# Patient Record
Sex: Female | Born: 1960 | Hispanic: No | Smoking: Former smoker
Health system: Southern US, Community
[De-identification: ages and names within clinical notes are randomized; demographics above are authoritative.]

## PROBLEM LIST (undated history)

## (undated) DIAGNOSIS — E119 Type 2 diabetes mellitus without complications: Secondary | ICD-10-CM

## (undated) DIAGNOSIS — M1990 Unspecified osteoarthritis, unspecified site: Secondary | ICD-10-CM

## (undated) DIAGNOSIS — I10 Essential (primary) hypertension: Secondary | ICD-10-CM

## (undated) DIAGNOSIS — J189 Pneumonia, unspecified organism: Secondary | ICD-10-CM

## (undated) HISTORY — DX: Type 2 diabetes mellitus without complications: E11.9

## (undated) HISTORY — PX: ABDOMINAL HYSTERECTOMY: SHX81

## (undated) HISTORY — PX: VEIN SURGERY: SHX48

## (undated) HISTORY — PX: FOOT SURGERY: SHX648

---

## 2006-11-17 ENCOUNTER — Ambulatory Visit (HOSPITAL_COMMUNITY): Admission: RE | Admit: 2006-11-17 | Discharge: 2006-11-17 | Payer: Self-pay | Admitting: Obstetrics and Gynecology

## 2006-11-17 ENCOUNTER — Encounter (INDEPENDENT_AMBULATORY_CARE_PROVIDER_SITE_OTHER): Payer: Self-pay | Admitting: Obstetrics and Gynecology

## 2006-12-09 ENCOUNTER — Encounter: Admission: RE | Admit: 2006-12-09 | Discharge: 2006-12-09 | Payer: Self-pay | Admitting: Gastroenterology

## 2007-05-28 ENCOUNTER — Ambulatory Visit (HOSPITAL_COMMUNITY): Admission: RE | Admit: 2007-05-28 | Discharge: 2007-05-28 | Payer: Self-pay | Admitting: Obstetrics and Gynecology

## 2007-10-06 ENCOUNTER — Ambulatory Visit (HOSPITAL_COMMUNITY): Admission: RE | Admit: 2007-10-06 | Discharge: 2007-10-07 | Payer: Self-pay | Admitting: Internal Medicine

## 2007-10-06 ENCOUNTER — Encounter (INDEPENDENT_AMBULATORY_CARE_PROVIDER_SITE_OTHER): Payer: Self-pay | Admitting: Obstetrics and Gynecology

## 2010-09-06 ENCOUNTER — Other Ambulatory Visit: Payer: Self-pay | Admitting: Specialist

## 2010-09-06 ENCOUNTER — Ambulatory Visit
Admission: RE | Admit: 2010-09-06 | Discharge: 2010-09-06 | Disposition: A | Discharge status: Home / Self Care | Payer: BC Managed Care – PPO | Source: Ambulatory Visit | Attending: Specialist | Admitting: Specialist

## 2010-09-06 DIAGNOSIS — T1490XA Injury, unspecified, initial encounter: Secondary | ICD-10-CM

## 2010-10-22 NOTE — Discharge Summary (Signed)
NAMEDARRIN, APODACA            ACCOUNT NO.:  [id number]   MEDICAL RECORD NO.:  [id number]          PATIENT TYPE:  OIB   LOCATION:  9305                          FACILITY:  [facility]   PHYSICIAN:  Duke Salvia. Marcelle Overlie, M.D.DATE OF BIRTH:  [birth year]   DATE OF ADMISSION:  10/06/2007  DATE OF DISCHARGE:  10/07/2007                               DISCHARGE SUMMARY   DISCHARGE DIAGNOSES:  1. Chronic pelvic pain, dysmenorrhea.  2. Laparoscopic-assisted vaginal hysterectomy, bilateral salpingo-      oophorectomy this admission.   Summary of the history and physical exam, please see admission H and P  for details.  Briefly, a 50 year old perimenopausal patient who has had  worsening pelvic pain and dysmenorrhea after a negative evaluation  including CT scan and diagnostic laparoscopy, presents for definitive  surgery.   HOSPITAL COURSE:  On October 06, 2007, under general anesthesia, the  patient underwent LAVH BSO with a 100 mL blood loss.  The following  a.m., her catheter was removed.  She was voiding without difficulty,  tolerating a regular diet, her abdominal exam was unremarkable and she  was afebrile.   LAB DATA:  Blood type is B negative, antibody screen negative.  CMET on  admission normal except for glucose 118.  WBC 7.0, hemoglobin 13.5,  magnesium of 38.3, platelets 239,000.  UPT negative.  Postop CBC on  October 07, 2007, WBC 10.0, hemoglobin 11.3, hematocrit 31.8.   DISPOSITION:  The patient discharged on Tylox p.r.n. pain.  Return to  the office in 1 week.  Advised to report for any incisional redness or  drainage, increased pain or bleeding or fever over 101.  She was given  specific instructions regarding diet, sex, exercise.   CONDITION:  Good.   ACTIVITY:  Graded increase.      Richard M. Marcelle Overlie, M.D.  Electronically Signed     RMH/MEDQ  D:  10/07/2007  T:  10/07/2007  Job:  [id number]

## 2010-10-22 NOTE — Op Note (Signed)
Tonya Farrell, Tonya Farrell            ACCOUNT NO.:  [id number]   MEDICAL RECORD NO.:  [id number]          PATIENT TYPE:  AMB   LOCATION:  SDC                           [facility]ILITY:  [facility]   PHYSICIAN:  Duke Salvia. Marcelle Overlie, M.D.DATE OF BIRTH:  1960/06/11   DATE OF PROCEDURE:  11/17/2006  DATE OF DISCHARGE:                               OPERATIVE REPORT   PREOPERATIVE DIAGNOSIS:  Acute chronic pelvic pain.   POSTOPERATIVE DIAGNOSIS:  Acute chronic pelvic pain.   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   SURGEON:  Dr. Marcelle Overlie   ANESTHESIA:  General.   COMPLICATIONS:  None.   DRAINS:  Foley catheter.   BLOOD LOSS:  Minimal.   SPECIMENS REMOVED:  Intraperitoneal mass.   PROCEDURE AND FINDINGS:  The patient was taken to the operating room and  after an adequate level of general endotracheal anesthesia obtained,  with the patient legs stirrups, the abdomen, perineum and vagina prepped  and usual manner for laparoscopy. Bladder was drained.  She did have  very small urethra.  We had to use pediatric Foley to adequately drain  the bladder.  This was left in place. Hulka tenaculum positioned after  EUA demonstrated uterus to the mid position, normal size, mobile, adnexa  negative.  The subumbilical area was infiltrated with half strength  Marcaine plain and a small incision was made. The Veress needle  introduced without difficulty.  Its intra-abdominal position verified by  pressure and water testing.  After 3 liters pneumoperitoneum was  created, laparoscopic trocar and sleeve were then introduced without  difficulty.  Three fingerbreadths above the symphysis in the midline a 5  mm trocar was inserted after 0.5% Marcaine plain local.  She was placed  in Trendelenburg and pelvic findings as follows.   The anterior posterior cul-de-sac spaces were free and clear, the uterus  itself was normal size.  The serosa was unremarkable.  Bilateral tubes  and ovaries normal.  There was no evidence of any endometriosis or  adhesive disease.  The cecum and upper abdomen were unremarkable.  The  only significant finding was 1.5 cm oval whitish free floating mass in  the cul-de-sac that may have represented a calcified epiploica or  calcified fibroma.  The lower incision was enlarged and this was removed  intact through the lower incision and sent to pathology.  No other  abnormalities were noted.  Instruments were removed, gas allowed escape.  Defects closed with 4-0 Dexon  subcuticular sutures.  The lower incision.  The fascia closed with a 2-0  Vicryl on a UR needle.  The fascia intact by palpation.  The Dermabond  used on the skin along with Steri-Strips.  She tolerated this well and  went to recovery room in good condition.  Clear urine noted at end of  the case.      Richard M. Marcelle Overlie, M.D.  Electronically Signed     RMH/MEDQ  D:  11/17/2006  T:  11/17/2006  Job:  [id number]

## 2010-10-22 NOTE — Op Note (Signed)
Tonya Farrell, Tonya Farrell            ACCOUNT NO.:  [id number]   MEDICAL RECORD NO.:  [id number]          PATIENT TYPE:  OIB   LOCATION:  9305                          [facility]ACILITY:  [facility]   PHYSICIAN:  Duke Salvia. Marcelle Overlie, M.D.DATE OF BIRTH:  April 10, 1961   DATE OF PROCEDURE:  DATE OF DISCHARGE:                               OPERATIVE REPORT   PREOPERATIVE DIAGNOSIS:  Chronic pelvic pain and dysmenorrhea.   POSTOPERATIVE DIAGNOSIS:  Chronic pelvic pain and dysmenorrhea.   PROCEDURE:  LAVH/BSO.   SURGEON:  Duke Salvia. Marcelle Overlie, MD   ASSISTANT:  Juluis Mire, MD   ANESTHESIA:  General endotracheal.   COMPLICATIONS:  None.   SPECIMENS REMOVED:  Uterus, bilateral tubes, and ovaries.   ESTIMATED BLOOD LOSS:  100 mL.   PROCEDURE AND FINDINGS:  The patient was taken to the operating room.  After adequate level of general anesthesia was obtained with the  patient's legs in stirrups, the abdomen, perineum, and vagina were  prepped and draped in usual manner for laparoscopy.  Bladder was  drained.  EUA carried out.  The uterus midposition, normal size, mobile,  adnexa negative.  Hulka tenaculum was positioned.  Attention directed to  the abdomen.  The subumbilical area was infiltrated with 0.25% Marcaine  plain.  Small incision was made.  Veress needle introduced without  difficulty at intra-abdominal position verified by pressure and water  testing.  After 2.5 liter pneumoperitoneum was created, laparoscopic  trocar and sleeve were then introduced.  There was no evidence of any  bleeding or trauma.  Three fingerbreadths above the symphysis in  midline, a 5-mm trocar was inserted under direct visualization.  The  patient was then placed in Trendelenburg and pelvic findings were as  follows.   As noted on her recent laparoscopic exam, the uterus, bilateral tubes  and ovaries were unremarkable.  No other abnormalities were noted.  Cul-  de-sac was free and clear.   Right tube and ovary were  grasped and placed on tension toward the  midline.  Course of the ureter was noted to be well below the right IP  ligament.  It was then coagulated and divided with the gyrus PK  instrument close to the ovary.  This was dissected down to and including  the round ligament on that side with excellent hemostasis.  The exact  same repeated on the opposite side, again after identifying the course  of the ureter well below.  Once this was completed, the vaginal portion  of procedure was started.   The legs were extended.  Weighted speculum was positioned.  Cervix was  grasped with a tenaculum.  Cervical vaginal mucosa was incised.  Posterior culdotomy performed without difficulty.  Bladder was advanced  superiorly with sharp and blunt dissection, the anterior peritoneum was  identified, entered sharply, and a retractor used to then gently elevate  the bladder out of the field.  Using the handheld gyrus PK instrument,  the uterosacral ligament, cardinal ligament, uterine vasculature  pedicle, and upper broad ligament pedicles were coagulated and divided.  The fundus of the uterus then delivered posteriorly.  The remaining  pedicles were clamped, coagulated, and divided.  Posterior cuff was then  run from 3 to 9 o'clock with a running 2-0 Vicryl locked suture.  McCall's culdoplasty suture was then positioned from left uterosacral  ligament picking up posterior peritoneum across to the right uterosacral  ligament and tied down for extra posterior support.  Prior to closure,  sponge, needle, and instrument counts were reported as correct x2.  Vaginal mucosa was then closed right to left with 2-0 Monocryl  interrupted sutures.  Foley catheter was positioned and draining clear  urine.  Repeat laparoscopy at that point with the Nezhat suction  irrigator revealed excellent hemostasis even at reduced pressure.  Instruments were removed, gas allowed to escape, defects were closed  with 4-0 Dexon  subcuticular sutures and Dermabond.  She tolerated this  well and went to recovery room in good condition.      Richard M. Marcelle Overlie, M.D.  Electronically Signed     RMH/MEDQ  D:  10/06/2007  T:  10/06/2007  Job:  [id number]

## 2010-10-22 NOTE — H&P (Signed)
NAMEKathlene Farrell            ACCOUNT NO.:  [id number]   MEDICAL RECORD NO.:  [id number]          PATIENT TYPE:  AMB   LOCATION:                                [facility]ILITY:  [facility]   PHYSICIAN:  Duke Salvia. Marcelle Overlie, M.D.DATE OF BIRTH:  Oct 02, 1960   DATE OF ADMISSION:  11/17/2006  DATE OF DISCHARGE:                              HISTORY & PHYSICAL   CHIEF COMPLAINT:  Pelvic pain.   HISTORY OF PRESENT ILLNESS:  A 50 year old G2, P2, both children  delivered by cesarean, not currently using anything for contraception,  not sexually active.  Seen initially in April of this year complaining  of chronic pelvic pain that is worse during her period.  As part of our  evaluation we did ultrasound in the office that showed retroverted  uterus and no other abnormalities noted.  Her pain is worsened to the  point that she is missing work and requiring narcotics and presents now  for definitive diagnostic laparoscopy.  This procedure including risks  of bleeding, infection, the possibility of open or additional surgery,  adjacent organ injury, along with her expected recovery time all  reviewed with her, which she understands and accepts.   PAST MEDICAL HISTORY:  Allergies:  None.   Operations:  None.   Current medications:  Ibuprofen.   FAMILY HISTORY:  Otherwise unremarkable.   PHYSICAL EXAMINATION:  VITAL SIGNS:  Temperature 98.2, blood pressure  120/78.  HEENT:  Unremarkable.  NECK:  Supple without masses.  LUNGS:  Clear.  CARDIOVASCULAR:  Regular rate and rhythm without murmurs, rubs, gallops  noted.  BREASTS:  Without masses.  ABDOMEN:  Soft, flat and nontender.  PELVIC:  Normal external genitalia.  Vagina and cervix clear.  Uterus  midposition, normal size.  Adnexa negative.  EXTREMITIES:  Unremarkable.  NEUROLOGIC:  Unremarkable.   IMPRESSION:  Acute and chronic pelvic pain.   PLAN:  Diagnostic laparoscopy.  Procedure and risks reviewed as above.     Richard M. Marcelle Overlie,  M.D.  Electronically Signed    RMH/MEDQ  D:  11/12/2006  T:  11/12/2006  Job:  [id number]

## 2010-10-22 NOTE — H&P (Signed)
NAME:  Tonya Farrell, Tonya Farrell            ACCOUNT NO.:  [id number]   MEDICAL RECORD NO.:  [id number]          PATIENT TYPE:  AMB   LOCATION:                                [facility]   PHYSICIAN:  Duke Salvia. Marcelle Overlie, M.D.DATE OF BIRTH:  1961-01-15   DATE OF ADMISSION:  10/06/2007  DATE OF DISCHARGE:                              HISTORY & PHYSICAL   Date of scheduled surgery is April 29.   CHIEF COMPLAINT:  Chronic pelvic pain.   HISTORY OF PRESENT ILLNESS:  A 50 year old G2, P2, both children  delivered by cesarean has an almost 1-year history of dysmenorrhea and  worsening pelvic pain for which she was evaluated in our office.   Ultrasound in the office was unremarkable.  Pain persisted so that she  ultimately underwent diagnostic laparoscopy.  That was done November 17, 2006 that showed a small calcified fibroma or calcified epiploicae.  Otherwise fairly normal pelvic findings.   She has continued to have problems related to pain, in particular worse  during her periods leading to missing work.  Additionally, followup CT  was done that was unremarkable as far as explaining the pain.  Because  her pain continues to be associated with perimenstrual worsening, she  presents at this time for definitive LAVH BSO.  This procedure,  including risk of bleeding, infection, adjacent organ injury, possible  need for open additional surgery along with expected recovery time  issues related to HRT, wound infection, phlebitis all reviewed which she  understands and accepts.   PAST MEDICAL HISTORY:   ALLERGIES:  None.   OPERATIONS:  Cesarean section x2, diagnostic laparoscopy.   CURRENT MEDICATIONS:  Vicoprofen or Motrin p.r.n. pain.   FAMILY HISTORY:  Otherwise unremarkable.   PHYSICAL EXAM:  Temperature 98.2, blood pressure 120/78.  HEENT: Unremarkable.  NECK:  Supple.  Without masses.  LUNGS:  Clear.  CARDIOVASCULAR:  Rate and rhythm without murmurs, rubs gallops.  BREASTS:  Without  masses.  ABDOMEN:  Soft, flat, and nontender.  Pelvic exam normal external genitalia.  Vagina and cervix clear.  Uterus  mid position, normal size, mobile.  Adnexa negative.  No unusual  nodularity slightly tender on palpation of the uterine fundus.  EXTREMITIES:  Unremarkable.  NEUROLOGIC:  Unremarkable.   IMPRESSION:  Dysmenorrhea with chronic pelvic pain evaluation as noted  above.   PLAN:  LAVH BSO procedure and risks discussed as above.      Richard M. Marcelle Overlie, M.D.  Electronically Signed     RMH/MEDQ  D:  10/05/2007  T:  10/05/2007  Job:  [id number]

## 2011-03-04 LAB — COMPREHENSIVE METABOLIC PANEL
AST: 18
CO2: 28
Calcium: 9.3
Chloride: 108
GFR calc Af Amer: >60
GFR calc non Af Amer: >60
Glucose, Bld: 118 — ABNORMAL HIGH
Sodium: 142

## 2011-03-04 LAB — CBC
MCHC: 35.2
MCHC: 35.6
MCV: 91.1
MCV: 91.4
Platelets: 191
Platelets: 239
RBC: 4.2
WBC: 7

## 2011-03-04 LAB — ABO/RH: ABO/RH(D): B NEG

## 2011-03-04 LAB — TYPE AND SCREEN: ABO/RH(D): B NEG

## 2011-03-27 LAB — CBC: Hemoglobin: 14.2

## 2011-03-27 LAB — PREGNANCY, URINE: Preg Test, Ur: NEGATIVE

## 2012-07-12 ENCOUNTER — Ambulatory Visit (INDEPENDENT_AMBULATORY_CARE_PROVIDER_SITE_OTHER): Payer: PRIVATE HEALTH INSURANCE | Admitting: Physician Assistant

## 2012-07-12 VITALS — BP 130/83 | HR 80 | Temp 98.3°F | Resp 18 | Ht 62.0 in | Wt 182.0 lb

## 2012-07-12 DIAGNOSIS — K0889 Other specified disorders of teeth and supporting structures: Secondary | ICD-10-CM

## 2012-07-12 DIAGNOSIS — K089 Disorder of teeth and supporting structures, unspecified: Secondary | ICD-10-CM

## 2012-07-12 DIAGNOSIS — J069 Acute upper respiratory infection, unspecified: Secondary | ICD-10-CM

## 2012-07-12 MED ORDER — AMOXICILLIN-POT CLAVULANATE 875-125 MG PO TABS: 1.0000 | ORAL_TABLET | Freq: Two times a day (BID) | ORAL | Status: DC

## 2012-07-12 MED ORDER — IPRATROPIUM BROMIDE 0.03 % NA SOLN: 2.0000 | Freq: Two times a day (BID) | NASAL | Status: DC

## 2012-07-12 NOTE — Progress Notes (Signed)
Subjective:    Patient ID: Tonya Farrell, female    DOB: 1960-09-09, 52 y.o.   MRN: [id number]  HPI   Tonya Farrell is a 52 yr old female here with concern for illness.  States she has had 2-3 days of coughing, sneezing, and headache.  Has taken ibuprofen for symptoms, which has not helped.  Nyquil helps night time symptoms.  Denies fever, chills, or GI symptoms.  Additionally pt states that she is having tooth pain that started yesterday.  Front teeth hurting.  Hurst to eat.  States she last saw the dentist about a year ago.  Has had many teeth extracted and has partial lower dentures.  Pt states that she also has a history of dental infection requiring antibiotics.  Pt is diabetic.  Smoking 1/2ppd.   Review of Systems  Constitutional: Negative for fever and chills.  HENT: Positive for congestion, rhinorrhea, sneezing and dental problem (tooth pain). Negative for ear pain, sore throat and sinus pressure.   Respiratory: Positive for cough. Negative for shortness of breath and wheezing.   Cardiovascular: Negative.   Gastrointestinal: Negative.   Musculoskeletal: Negative.   Skin: Negative.   Neurological: Positive for headaches.       Objective:   Physical Exam  Vitals reviewed. Constitutional: She is oriented to person, place, and time. She appears well-developed and well-nourished. No distress.  HENT:  Head: Normocephalic and atraumatic. No trismus in the jaw.  Right Ear: Tympanic membrane and ear canal normal.  Left Ear: Tympanic membrane and ear canal normal.  Nose: Mucosal edema and rhinorrhea present. Right sinus exhibits no maxillary sinus tenderness and no frontal sinus tenderness. Left sinus exhibits no maxillary sinus tenderness and no frontal sinus tenderness.  Mouth/Throat: Uvula is midline, oropharynx is clear and moist and mucous membranes are normal. She has dentures (lower, partial). No oral lesions. No dental abscesses or dental caries.    Neck: Neck supple.    Cardiovascular: Normal rate, regular rhythm and normal heart sounds.  Exam reveals no gallop and no friction rub.   No murmur heard. Pulmonary/Chest: Effort normal and breath sounds normal. She has no wheezes. She has no rales.  Lymphadenopathy:       Head (right side): No submental, no submandibular, no tonsillar, no preauricular, no posterior auricular and no occipital adenopathy present.       Head (left side): No submental, no submandibular, no tonsillar, no preauricular, no posterior auricular and no occipital adenopathy present.    She has no cervical adenopathy.  Neurological: She is alert and oriented to person, place, and time.  Skin: Skin is warm and dry.  Psychiatric: She has a normal mood and affect. Her behavior is normal.     Filed Vitals:   07/12/12 1820  BP: 130/83  Pulse: 80  Temp: 98.3 F (36.8 C)  Resp: 18        Assessment & Plan:   1. Tooth pain  amoxicillin-clavulanate (AUGMENTIN) 875-125 MG per tablet  2. URI (upper respiratory infection)  ipratropium (ATROVENT) 0.03 % nasal spray     Tonya Farrell is a 52 yr old female here with 3 days of URI symptoms and 1 day of tooth pain.  Suspect viral etiology of URI given mild symptoms and absence of fever.  Will treat nasal symptoms with Atrovent.  Pt states cough is well controlled, will continue using OTC medication.  No obvious dental caries or abscesses.  No drainage.  Right central incisor with pain to the touch.  Encouraged her to call her dentist tomorrow as she needs to be evaluated.  I have also started her on Augmentin.  Discussed RTC precautions with patient who understands and is in agreement with this plan.

## 2012-07-12 NOTE — Patient Instructions (Addendum)
Begin taking the antibiotic tonight.  Take with food to reduce stomach upset.  Call your dentist tomorrow!  Use the nasal spray twice daily to help with runny nose and sneezing.  Let us know if you are worsening or not improving.   Upper Respiratory Infection, Adult An upper respiratory infection (URI) is also sometimes known as the common cold. The upper respiratory tract includes the nose, sinuses, throat, trachea, and bronchi. Bronchi are the airways leading to the lungs. Most people improve within 1 week, but symptoms can last up to 2 weeks. A residual cough may last even longer.  CAUSES Many different viruses can infect the tissues lining the upper respiratory tract. The tissues become irritated and inflamed and often become very moist. Mucus production is also common. A cold is contagious. You can easily spread the virus to others by oral contact. This includes kissing, sharing a glass, coughing, or sneezing. Touching your mouth or nose and then touching a surface, which is then touched by another person, can also spread the virus. SYMPTOMS  Symptoms typically develop 1 to 3 days after you come in contact with a cold virus. Symptoms vary from person to person. They may include:  Runny nose.  Sneezing.  Nasal congestion.  Sinus irritation.  Sore throat.  Loss of voice (laryngitis).  Cough.  Fatigue.  Muscle aches.  Loss of appetite.  Headache.  Low-grade fever. DIAGNOSIS  You might diagnose your own cold based on familiar symptoms, since most people get a cold 2 to 3 times a year. Your caregiver can confirm this based on your exam. Most importantly, your caregiver can check that your symptoms are not due to another disease such as strep throat, sinusitis, pneumonia, asthma, or epiglottitis. Blood tests, throat tests, and X-rays are not necessary to diagnose a common cold, but they may sometimes be helpful in excluding other more serious diseases. Your caregiver will decide if  any further tests are required. RISKS AND COMPLICATIONS  You may be at risk for a more severe case of the common cold if you smoke cigarettes, have chronic heart disease (such as heart failure) or lung disease (such as asthma), or if you have a weakened immune system. The very young and very old are also at risk for more serious infections. Bacterial sinusitis, middle ear infections, and bacterial pneumonia can complicate the common cold. The common cold can worsen asthma and chronic obstructive pulmonary disease (COPD). Sometimes, these complications can require emergency medical care and may be life-threatening. PREVENTION  The best way to protect against getting a cold is to practice good hygiene. Avoid oral or hand contact with people with cold symptoms. Wash your hands often if contact occurs. There is no clear evidence that vitamin C, vitamin E, echinacea, or exercise reduces the chance of developing a cold. However, it is always recommended to get plenty of rest and practice good nutrition. TREATMENT  Treatment is directed at relieving symptoms. There is no cure. Antibiotics are not effective, because the infection is caused by a virus, not by bacteria. Treatment may include:  Increased fluid intake. Sports drinks offer valuable electrolytes, sugars, and fluids.  Breathing heated mist or steam (vaporizer or shower).  Eating chicken soup or other clear broths, and maintaining good nutrition.  Getting plenty of rest.  Using gargles or lozenges for comfort.  Controlling fevers with ibuprofen or acetaminophen as directed by your caregiver.  Increasing usage of your inhaler if you have asthma. Zinc gel and zinc lozenges, taken  in the first 24 hours of the common cold, can shorten the duration and lessen the severity of symptoms. Pain medicines may help with fever, muscle aches, and throat pain. A variety of non-prescription medicines are available to treat congestion and runny nose. Your  caregiver can make recommendations and may suggest nasal or lung inhalers for other symptoms.  HOME CARE INSTRUCTIONS   Only take over-the-counter or prescription medicines for pain, discomfort, or fever as directed by your caregiver.  Use a warm mist humidifier or inhale steam from a shower to increase air moisture. This may keep secretions moist and make it easier to breathe.  Drink enough water and fluids to keep your urine clear or pale yellow.  Rest as needed.  Return to work when your temperature has returned to normal or as your caregiver advises. You may need to stay home longer to avoid infecting others. You can also use a face mask and careful hand washing to prevent spread of the virus. SEEK MEDICAL CARE IF:   After the first few days, you feel you are getting worse rather than better.  You need your caregiver's advice about medicines to control symptoms.  You develop chills, worsening shortness of breath, or brown or red sputum. These may be signs of pneumonia.  You develop yellow or brown nasal discharge or pain in the face, especially when you bend forward. These may be signs of sinusitis.  You develop a fever, swollen neck glands, pain with swallowing, or white areas in the back of your throat. These may be signs of strep throat. SEEK IMMEDIATE MEDICAL CARE IF:   You have a fever.  You develop severe or persistent headache, ear pain, sinus pain, or chest pain.  You develop wheezing, a prolonged cough, cough up blood, or have a change in your usual mucus (if you have chronic lung disease).  You develop sore muscles or a stiff neck. Document Released: 11/19/2000 Document Revised: 08/18/2011 Document Reviewed: 09/27/2010 [facility] Patient Information 2013 [place], [place].

## 2016-07-17 ENCOUNTER — Ambulatory Visit (HOSPITAL_COMMUNITY)
Admission: EM | Admit: 2016-07-17 | Discharge: 2016-07-17 | Disposition: A | Discharge status: Home / Self Care | Payer: PRIVATE HEALTH INSURANCE | Attending: Emergency Medicine | Admitting: Emergency Medicine

## 2016-07-17 ENCOUNTER — Encounter (HOSPITAL_COMMUNITY): Payer: Self-pay | Admitting: Emergency Medicine

## 2016-07-17 DIAGNOSIS — M1711 Unilateral primary osteoarthritis, right knee: Secondary | ICD-10-CM | POA: Diagnosis not present

## 2016-07-17 MED ORDER — DICLOFENAC SODIUM 75 MG PO TBEC: 75.0000 mg | DELAYED_RELEASE_TABLET | Freq: Two times a day (BID) | ORAL | 0 refills | Status: DC

## 2016-07-17 NOTE — ED Provider Notes (Signed)
CSN: [id number]     Arrival date & time 07/17/16  1458 History   First MD Initiated Contact with Patient 07/17/16 1607     Chief Complaint  Patient presents with  . Knee Pain   (Consider location/radiation/quality/duration/timing/severity/associated sxs/prior Treatment) 56 year old female presents with 1-2 month history of left knee pain. Patient denies any traumatic history, she has had swelling in the joint, pain is worse in the morning upon arising and improves with movement throughout the day. She is on her feet most of the day and climbs multiple stairs are work.    The history is provided by the patient.  Knee Pain    Past Medical History:  Diagnosis Date  . Diabetes mellitus without complication [facility])    Past Surgical History:  Procedure Laterality Date  . FOOT SURGERY Right    History reviewed. No pertinent family history. Social History  Substance Use Topics  . Smoking status: Current Every Day Smoker    Packs/day: 1.00    Types: Cigarettes  . Smokeless tobacco: Never Used  . Alcohol use No   OB History    No data available     Review of Systems  Reason unable to perform ROS: as covered in HPI.  All other systems reviewed and are negative.   Allergies  Patient has no known allergies.  Home Medications   Prior to Admission medications   Medication Sig Start Date End Date Taking? Authorizing Provider  ibuprofen (ADVIL,MOTRIN) 800 MG tablet Take 800 mg by mouth every 8 (eight) hours as needed for moderate pain.   Yes Historical Provider, MD  metFORMIN (GLUCOPHAGE) 500 MG tablet Take 500 mg by mouth 2 (two) times daily with a meal.   Yes Historical Provider, MD  amoxicillin-clavulanate (AUGMENTIN) 875-125 MG per tablet Take 1 tablet by mouth 2 (two) times daily. 07/12/12   Eleanore Delia Chimes, PA-C  diclofenac (VOLTAREN) 75 MG EC tablet Take 1 tablet (75 mg total) by mouth 2 (two) times daily. 07/17/16   Dorena Bodo, NP  ipratropium (ATROVENT) 0.03 % nasal spray  Place 2 sprays into the nose 2 (two) times daily. 07/12/12   Godfrey Pick, PA-C   Meds Ordered and Administered this Visit  Medications - No data to display  BP 142/84 (BP Location: Right Arm)   Pulse 83   Temp 98.3 F (36.8 C) (Oral)   SpO2 95%  No data found.   Physical Exam  Constitutional: She is oriented to person, place, and time. She appears well-developed and well-nourished. No distress.  HENT:  Head: Normocephalic and atraumatic.  Musculoskeletal:       Left knee: She exhibits swelling. She exhibits normal range of motion, no effusion, no ecchymosis, no deformity, no erythema, normal alignment and normal patellar mobility. No tenderness found. No medial joint line, no lateral joint line, no MCL, no LCL and no patellar tendon tenderness noted.  Neurological: She is alert and oriented to person, place, and time.  Skin: Skin is warm and dry. Capillary refill takes less than 2 seconds. She is not diaphoretic.  Nursing note and vitals reviewed.   Urgent Care Course     Procedures (including critical care time)  Labs Review Labs Reviewed - No data to display  Imaging Review No results found.   Visual Acuity Review  Right Eye Distance:   Left Eye Distance:   Bilateral Distance:    Right Eye Near:   Left Eye Near:    Bilateral Near:  MDM   1. Arthritis of right knee    Based on your signs and symptoms, I believe you have arthritis in your knee. I have prescribed an antiinflammatory called diclofenac, take 1 tablet twice a day. You may also take tylenol as well every 4-6 hours, not to exceed 4,000 mg a day. Should your symptoms fail to resolve, I would recommend you follow up with an orthopedic doctor for further evaluation.      Dorena BodoLawrence Lenny Bouchillon, NP 07/17/16 [phone]

## 2016-07-17 NOTE — ED Triage Notes (Signed)
Pt states her left medial knee has been hurting her for 15-20 days.  She denies any injury to the knee, but she does report climbing stairs all day at work.

## 2016-07-17 NOTE — Discharge Instructions (Signed)
Based on your signs and symptoms, I believe you have arthritis in your knee. I have prescribed an antiinflammatory called diclofenac, take 1 tablet twice a day. You may also take tylenol as well every 4-6 hours, not to exceed 4,000 mg a day. Should your symptoms fail to resolve, I would recommend you follow up with an orthopedic doctor for further evaluation.

## 2016-09-02 ENCOUNTER — Telehealth: Payer: Self-pay | Admitting: *Deleted

## 2016-09-02 DIAGNOSIS — E669 Obesity, unspecified: Secondary | ICD-10-CM | POA: Insufficient documentation

## 2016-09-02 DIAGNOSIS — Z87891 Personal history of nicotine dependence: Secondary | ICD-10-CM | POA: Insufficient documentation

## 2016-09-02 DIAGNOSIS — Z720 Tobacco use: Secondary | ICD-10-CM | POA: Insufficient documentation

## 2016-09-02 DIAGNOSIS — M25562 Pain in left knee: Secondary | ICD-10-CM | POA: Insufficient documentation

## 2016-09-02 DIAGNOSIS — R7303 Prediabetes: Secondary | ICD-10-CM | POA: Insufficient documentation

## 2016-09-02 DIAGNOSIS — E119 Type 2 diabetes mellitus without complications: Secondary | ICD-10-CM | POA: Insufficient documentation

## 2016-09-02 NOTE — Telephone Encounter (Signed)
Pt presented with new Rxs from new PCP that she saw yesterday. Metformin was changed to ER once daily. This is not available in clinic dispensary. Explained to pt that she could pick up the ER at the pharmacy and take one pill a day, or she could get the med from clinic for free but would have to take 2 pills a day. She prefers to remain on 2 pills a day to receive through clinic. Called Dr. Leonie GreenBriscoe's office and left mesg with receptionist to inform MD of the same. [facility] to [facility] Metformin 500mg  1 tab twice daily #60 RF0. Also filled Diclofenac 75mg  1 tab twice daily #60 RF11.

## 2016-09-16 ENCOUNTER — Other Ambulatory Visit: Payer: Self-pay | Admitting: Family Medicine

## 2016-09-16 DIAGNOSIS — E2839 Other primary ovarian failure: Secondary | ICD-10-CM

## 2016-09-16 DIAGNOSIS — Z1231 Encounter for screening mammogram for malignant neoplasm of breast: Secondary | ICD-10-CM

## 2016-10-06 ENCOUNTER — Ambulatory Visit
Admission: RE | Admit: 2016-10-06 | Discharge: 2016-10-06 | Disposition: A | Discharge status: Home / Self Care | Payer: PRIVATE HEALTH INSURANCE | Source: Ambulatory Visit | Attending: Family Medicine | Admitting: Family Medicine

## 2016-10-06 DIAGNOSIS — E2839 Other primary ovarian failure: Secondary | ICD-10-CM

## 2016-10-06 DIAGNOSIS — Z1231 Encounter for screening mammogram for malignant neoplasm of breast: Secondary | ICD-10-CM

## 2017-02-26 DIAGNOSIS — M23204 Derangement of unspecified medial meniscus due to old tear or injury, left knee: Secondary | ICD-10-CM | POA: Insufficient documentation

## 2017-03-27 DIAGNOSIS — Z9889 Other specified postprocedural states: Secondary | ICD-10-CM | POA: Insufficient documentation

## 2017-07-16 DIAGNOSIS — M7052 Other bursitis of knee, left knee: Secondary | ICD-10-CM | POA: Insufficient documentation

## 2017-12-15 ENCOUNTER — Ambulatory Visit: Payer: Self-pay | Admitting: Registered Nurse

## 2017-12-15 ENCOUNTER — Encounter: Payer: Self-pay | Admitting: Registered Nurse

## 2017-12-15 VITALS — BP 98/66 | HR 80

## 2017-12-15 DIAGNOSIS — R1013 Epigastric pain: Secondary | ICD-10-CM

## 2017-12-15 MED ORDER — OMEPRAZOLE 20 MG PO CPDR: 20.0000 mg | DELAYED_RELEASE_CAPSULE | Freq: Every day | ORAL | 0 refills | Status: DC

## 2017-12-15 MED ORDER — CALCIUM CARBONATE ANTACID 500 MG PO CHEW
1.0000 | CHEWABLE_TABLET | Freq: Once | ORAL | Status: AC
Start: 2017-12-15 — End: 2017-12-15
  Administered 2017-12-15: 200 mg via ORAL

## 2017-12-15 NOTE — Patient Instructions (Signed)
Nonspecific Chest Pain Chest pain can be caused by many different conditions. There is always a chance that your pain could be related to something serious, such as a heart attack or a blood clot in your lungs. Chest pain can also be caused by conditions that are not life-threatening. If you have chest pain, it is very important to follow up with your health care provider. What are the causes? Causes of this condition include:  Heartburn.  Pneumonia or bronchitis.  Anxiety or stress.  Inflammation around your heart (pericarditis) or lung (pleuritis or pleurisy).  A blood clot in your lung.  A collapsed lung (pneumothorax). This can develop suddenly on its own (spontaneous pneumothorax) or from trauma to the chest.  Shingles infection (varicella-zoster virus).  Heart attack.  Damage to the bones, muscles, and cartilage that make up your chest wall. This can include: ? Bruised bones due to injury. ? Strained muscles or cartilage due to frequent or repeated coughing or overwork. ? Fracture to one or more ribs. ? Sore cartilage due to inflammation (costochondritis).  What increases the risk? Risk factors for this condition may include:  Activities that increase your risk for trauma or injury to your chest.  Respiratory infections or conditions that cause frequent coughing.  Medical conditions or overeating that can cause heartburn.  Heart disease or family history of heart disease.  Conditions or health behaviors that increase your risk of developing a blood clot.  Having had chicken pox (varicella zoster).  What are the signs or symptoms? Chest pain can feel like:  Burning or tingling on the surface of your chest or deep in your chest.  Crushing, pressure, aching, or squeezing pain.  Dull or sharp pain that is worse when you move, cough, or take a deep breath.  Pain that is also felt in your back, neck, shoulder, or arm, or pain that spreads to any of these  areas.  Your chest pain may come and go, or it may stay constant. How is this diagnosed? Lab tests or other studies may be needed to find the cause of your pain. Your health care provider may have you take a test called an ECG (electrocardiogram). An ECG records your heartbeat patterns at the time the test is performed. You may also have other tests, such as:  Transthoracic echocardiogram (TTE). In this test, sound waves are used to create a picture of the heart structures and to look at how blood flows through your heart.  Transesophageal echocardiogram (TEE).This is a more advanced imaging test that takes images from inside your body. It allows your health care provider to see your heart in finer detail.  Cardiac monitoring. This allows your health care provider to monitor your heart rate and rhythm in real time.  Holter monitor. This is a portable device that records your heartbeat and can help to diagnose abnormal heartbeats. It allows your health care provider to track your heart activity for several days, if needed.  Stress tests. These can be done through exercise or by taking medicine that makes your heart beat more quickly.  Blood tests.  Other imaging tests.  How is this treated? Treatment depends on what is causing your chest pain. Treatment may include:  Medicines. These may include: ? Acid blockers for heartburn. ? Anti-inflammatory medicine. ? Pain medicine for inflammatory conditions. ? Antibiotic medicine, if an infection is present. ? Medicines to dissolve blood clots. ? Medicines to treat coronary artery disease (CAD).  Supportive care for conditions that   do not require medicines. This may include: ? Resting. ? Applying heat or cold packs to injured areas. ? Limiting activities until pain decreases.  Follow these instructions at home: Medicines  If you were prescribed an antibiotic, take it as told by your health care provider. Do not stop taking the  antibiotic even if you start to feel better.  Take over-the-counter and prescription medicines only as told by your health care provider. Lifestyle  Do not use any products that contain nicotine or tobacco, such as cigarettes and e-cigarettes. If you need help quitting, ask your health care provider.  Do not drink alcohol.  Make lifestyle changes as directed by your health care provider. These may include: ? Getting regular exercise. Ask your health care provider to suggest some activities that are safe for you. ? Eating a heart-healthy diet. A registered dietitian can help you to learn healthy eating options. ? Maintaining a healthy weight. ? Managing diabetes, if necessary. ? Reducing stress, such as with yoga or relaxation techniques. General instructions  Avoid any activities that bring on chest pain.  If heartburn is the cause for your chest pain, raise (elevate) the head of your bed about 6 inches (15 cm) by putting blocks under the legs. Sleeping with more pillows does not effectively relieve heartburn because it only changes the position of your head.  Keep all follow-up visits as told by your health care provider. This is important. This includes any further testing if your chest pain does not go away. Contact a health care provider if:  Your chest pain does not go away.  You have a rash with blisters on your chest.  You have a fever.  You have chills. Get help right away if:  Your chest pain is worse.  You have a cough that gets worse, or you cough up blood.  You have severe pain in your abdomen.  You have severe weakness.  You faint.  You have sudden, unexplained chest discomfort.  You have sudden, unexplained discomfort in your arms, back, neck, or jaw.  You have shortness of breath at any time.  You suddenly start to sweat, or your skin gets clammy.  You feel nauseous or you vomit.  You suddenly feel light-headed or dizzy.  Your heart begins to beat  quickly, or it feels like it is skipping beats. These symptoms may represent a serious problem that is an emergency. Do not wait to see if the symptoms will go away. Get medical help right away. Call your local emergency services (911 in the [place] Do not drive yourself to the hospital. This information is not intended to replace advice given to you by your health care provider. Make sure you discuss any questions you have with your health care provider. Document Released: 03/05/2005 Document Revised: 02/18/2016 Document Reviewed: 02/18/2016 Elsevier Interactive Patient Education  2017 [employer] Esophagitis Esophagitis is inflammation of the esophagus. The esophagus is the tube that carries food and liquids from your mouth to your stomach. Esophagitis can cause soreness or pain in the esophagus. This condition can make it difficult and painful to swallow. What are the causes? Most causes of esophagitis are not serious. Common causes of this condition include:  Gastroesophageal reflux disease (GERD). This is when stomach contents move back up into the esophagus (reflux).  Repeated vomiting.  An allergic-type reaction, especially caused by food allergies (eosinophilic esophagitis).  Injury to the esophagus by swallowing large pills with or without water, or swallowing certain types of  medicines.  Swallowing (ingesting) harmful chemicals, such as household cleaning products.  Heavy alcohol use.  An infection of the esophagus.This most often occurs in people who have a weakened immune system.  Radiation or chemotherapy treatment for cancer.  Certain diseases such as sarcoidosis, Crohn disease, and scleroderma.  What are the signs or symptoms? Symptoms of this condition include:  Difficult or painful swallowing.  Pain with swallowing acidic liquids, such as citrus juices.  Pain with burping.  Chest pain.  Difficulty breathing.  Nausea.  Vomiting.  Pain in the  abdomen.  Weight loss.  Ulcers in the mouth.  Patches of white material in the mouth (candidiasis).  Fever.  Coughing up blood or vomiting blood.  Stool that is black, tarry, or bright red.  How is this diagnosed? Your health care provider will take a medical history and perform a physical exam. You may also have other tests, including:  An endoscopy to examine your stomach and esophagus with a small camera.  A test that measures the acidity level in your esophagus.  A test that measures how much pressure is on your esophagus.  A barium swallow or modified barium swallow to show the shape, size, and functioning of your esophagus.  Allergy tests.  How is this treated? Treatment for this condition depends on the cause of your esophagitis. In some cases, steroids or other medicines may be given to help relieve your symptoms or to treat the underlying cause of your condition. You may have to make some lifestyle changes, such as:  Avoiding alcohol.  Quitting smoking.  Changing your diet.  Exercising.  Changing your sleep habits and your sleep environment.  Follow these instructions at home: Take these actions to decrease your discomfort and to help avoid complications. Diet  Follow a diet as recommended by your health care provider. This may involve avoiding foods and drinks such as: ? Coffee and tea (with or without caffeine). ? Drinks that contain alcohol. ? Energy drinks and sports drinks. ? Carbonated drinks or sodas. ? Chocolate and cocoa. ? Peppermint and mint flavorings. ? Garlic and onions. ? Horseradish. ? Spicy and acidic foods, including peppers, chili powder, curry powder, vinegar, hot sauces, and barbecue sauce. ? Citrus fruit juices and citrus fruits, such as oranges, lemons, and limes. ? Tomato-based foods, such as red sauce, chili, salsa, and pizza with red sauce. ? Fried and fatty foods, such as donuts, french fries, potato chips, and high-fat  dressings. ? High-fat meats, such as hot dogs and fatty cuts of red and white meats, such as rib eye steak, sausage, ham, and bacon. ? High-fat dairy items, such as whole milk, butter, and cream cheese.  Eat small, frequent meals instead of large meals.  Avoid drinking large amounts of liquid with your meals.  Avoid eating meals during the 2-3 hours before bedtime.  Avoid lying down right after you eat.  Do not exercise right after you eat.  Avoid foods and drinks that seem to make your symptoms worse. General instructions  Pay attention to any changes in your symptoms.  Take over-the-counter and prescription medicines only as told by your health care provider. Do not take aspirin, ibuprofen, or other NSAIDs unless your health care provider told you to do so.  If you have trouble taking pills, use a pill splitter to decrease the size of the pill. This will decrease the chance of the pill getting stuck or injuring your esophagus on the way down. Also, drink water after you  take a pill.  Do not use any tobacco products, including cigarettes, chewing tobacco, and e-cigarettes. If you need help quitting, ask your health care provider.  Wear loose-fitting clothing. Do not wear anything tight around your waist that causes pressure on your abdomen.  Raise (elevate) the head of your bed about 6 inches (15 cm).  Try to reduce your stress, such as with yoga or meditation. If you need help reducing stress, ask your health care provider.  If you are overweight, reduce your weight to an amount that is healthy for you. Ask your health care provider for guidance about a safe weight loss goal.  Keep all follow-up visits as told by your health care provider. This is important. Contact a health care provider if:  You have new symptoms.  You have unexplained weight loss.  You have difficulty swallowing, or it hurts to swallow.  You have wheezing or a persistent cough.  Your symptoms do not  improve with treatment.  You have frequent heartburn for more than two weeks. Get help right away if:  You have severe pain in your arms, neck, jaw, teeth, or back.  You feel sweaty, dizzy, or light-headed.  You have chest pain or shortness of breath.  You vomit and your vomit looks like blood or coffee grounds.  Your stool is bloody or black.  You have a fever.  You cannot swallow, drink, or eat. This information is not intended to replace advice given to you by your health care provider. Make sure you discuss any questions you have with your health care provider. Document Released: 07/03/2004 Document Revised: 11/01/2015 Document Reviewed: 09/20/2014 Elsevier Interactive Patient Education  [employer]2018 [employer]

## 2017-12-15 NOTE — Progress Notes (Signed)
Pt returns to clinic at 1245 for recheck. Reports pain is much improved. Medicine "helped a lot, pain is only a little bit every now and then" per pt. Pt's color has returned to her face. She reports no diaphoresis or dizziness. Advised her to continue the daily Omeprazole x1 month and f/u in clinic in 2 days for recheck. She verbalizes understanding and agreement.

## 2017-12-15 NOTE — Progress Notes (Signed)
Subjective:    Patient ID: Tonya Farrell, female    DOB: [birth year], 57 y.o.   MRN: [id number]  57y/o Caucasian female pt c/o non-radiating epigastric pain that began 5-68min after drinking cup of very cold water. Shortly after that she began to feel dizzy and become diaphoretic. Sx have progressively worsened. Pt ambulatory to clinic after 2 hrs of sx. Pt needed to lean on something while standing in clinic prior to sitting for triage. Appears pale and sweaty.  She states she rarely drinks water, usually only coffee. Has had no food intake today. CBG 143. States her lowest cbg she has had in the past is 110. BP found to be low on manual recheck. Denies n/v/d/c or recent illness. Feels slightly better after sitting in clinic prior to BP check. Given 2 Tums and 20mg  Omeprazole DR by NP Betancourt from clinic [facility] after examination. Going to eat lunch and return to clinic for recheck after lunch at 12:30.  1 cigarette this am; no breakfast; denied history of stomach problems + sick contacts at work     Review of Systems  Constitutional: Positive for diaphoresis. Negative for activity change, appetite change, chills, fatigue and fever.  HENT: Negative for congestion, dental problem, drooling, ear discharge, ear pain, facial swelling, hearing loss, mouth sores, nosebleeds, postnasal drip, rhinorrhea, sinus pressure, sinus pain, sneezing, sore throat, tinnitus, trouble swallowing and voice change.   Eyes: Negative for photophobia, pain, discharge and visual disturbance.  Respiratory: Negative for cough, choking, chest tightness, shortness of breath, wheezing and stridor.   Cardiovascular: Negative for chest pain and leg swelling.  Gastrointestinal: Positive for abdominal pain. Negative for abdominal distention, anal bleeding, blood in stool, constipation, diarrhea, nausea and vomiting.  Endocrine: Negative for cold intolerance and heat intolerance.  Genitourinary: Negative for  difficulty urinating, dysuria, flank pain and hematuria.  Musculoskeletal: Negative for arthralgias, back pain and myalgias.  Skin: Negative for rash.  Allergic/Immunologic: Negative for environmental allergies and food allergies.  Neurological: Positive for dizziness, weakness and light-headedness. Negative for tremors, seizures, syncope, facial asymmetry, speech difficulty, numbness and headaches.  Hematological: Negative for adenopathy. Does not bruise/bleed easily.  Psychiatric/Behavioral: Negative for agitation, confusion and sleep disturbance. The patient is not nervous/anxious.        Objective:   Physical Exam  Constitutional: She is oriented to person, place, and time. She appears well-developed and well-nourished. She is active and cooperative.  Non-toxic appearance. She does not have a sickly appearance. She appears ill. No distress.  HENT:  Head: Normocephalic and atraumatic.  Right Ear: Hearing, external ear and ear canal normal. A middle ear effusion is present.  Left Ear: Hearing, external ear and ear canal normal. A middle ear effusion is present.  Nose: Mucosal edema and rhinorrhea present. No nose lacerations, sinus tenderness, nasal deformity, septal deviation or nasal septal hematoma. No epistaxis.  No foreign bodies. Right sinus exhibits no maxillary sinus tenderness and no frontal sinus tenderness. Left sinus exhibits no maxillary sinus tenderness and no frontal sinus tenderness.  Mouth/Throat: Uvula is midline and mucous membranes are normal. Mucous membranes are not pale, not dry and not cyanotic. She does not have dentures. No oral lesions. No trismus in the jaw. Normal dentition. No dental abscesses, uvula swelling, lacerations or dental caries. Posterior oropharyngeal edema and posterior oropharyngeal erythema present. No oropharyngeal exudate or tonsillar abscesses. No tonsillar exudate.  Bilateral TMs air fluid level clear; cobblestoning posterior pharynx; bilateral  allergic shiners  Eyes: Pupils are equal,  round, and reactive to light. Conjunctivae, EOM and lids are normal. Right eye exhibits no chemosis, no discharge, no exudate and no hordeolum. No foreign body present in the right eye. Left eye exhibits no chemosis, no discharge, no exudate and no hordeolum. No foreign body present in the left eye. Right conjunctiva is not injected. Right conjunctiva has no hemorrhage. Left conjunctiva is not injected. Left conjunctiva has no hemorrhage. No scleral icterus. Right eye exhibits normal extraocular motion and no nystagmus. Left eye exhibits normal extraocular motion and no nystagmus. Right pupil is round and reactive. Left pupil is round and reactive. Pupils are equal.  Neck: Trachea normal, normal range of motion and phonation normal. Neck supple. No tracheal tenderness, no spinous process tenderness and no muscular tenderness present. No neck rigidity. No tracheal deviation, no edema, no erythema and normal range of motion present. No thyroid mass and no thyromegaly present.  Cardiovascular: Normal rate, regular rhythm, S1 normal, S2 normal, normal heart sounds and intact distal pulses. PMI is not displaced. Exam reveals no gallop and no friction rub.  No murmur heard. Pulses:      Radial pulses are 2+ on the right side, and 2+ on the left side.  Pulmonary/Chest: Effort normal and breath sounds normal. No accessory muscle usage or stridor. No respiratory distress. She has no decreased breath sounds. She has no wheezes. She has no rhonchi. She has no rales. She exhibits no tenderness.  Spoke full sentences without diffculty; no cough observed in exam room; sp02 satable 97-98% room air during exam/speaking for history  Abdominal: Soft. Normal appearance. She exhibits no shifting dullness, no distension, no pulsatile liver, no fluid wave, no abdominal bruit, no ascites, no pulsatile midline mass and no mass. Bowel sounds are decreased. There is no hepatosplenomegaly.  There is tenderness in the epigastric area. There is no rigidity, no rebound, no guarding, no CVA tenderness, no tenderness at McBurney's point and negative Murphy's sign. Hernia confirmed negative in the ventral area.    Epigastric pain with and without palpation; on/off exam table supine to sitting and reverse without difficulty; dull to percussion x  Quads; hypoactive bowel sounds x 4 quads  Musculoskeletal: Normal range of motion. She exhibits no edema or tenderness.       Right shoulder: Normal.       Left shoulder: Normal.       Right elbow: Normal.      Left elbow: Normal.       Right hip: Normal.       Left hip: Normal.       Right knee: Normal.       Left knee: Normal.       Cervical back: Normal.       Thoracic back: Normal.       Lumbar back: Normal.       Right hand: Normal.       Left hand: Normal.  Lymphadenopathy:       Head (right side): No submental, no submandibular, no tonsillar, no preauricular, no posterior auricular and no occipital adenopathy present.       Head (left side): No submental, no submandibular, no tonsillar, no preauricular, no posterior auricular and no occipital adenopathy present.    She has no cervical adenopathy.       Right cervical: No superficial cervical, no deep cervical and no posterior cervical adenopathy present.      Left cervical: No superficial cervical, no deep cervical and no posterior cervical adenopathy present.  Neurological: She is alert and oriented to person, place, and time. She has normal strength. She is not disoriented. She displays no atrophy and no tremor. No cranial nerve deficit or sensory deficit. She exhibits normal muscle tone. She displays no seizure activity. Coordination and gait normal. GCS eye subscore is 4. GCS verbal subscore is 5. GCS motor subscore is 6.  On/off exam table; in/out of chair without difficulty; gait sure and steady in clinic and hallway  Skin: Skin is warm, dry and intact. Capillary refill takes  less than 2 seconds. No abrasion, no bruising, no burn, no ecchymosis, no laceration, no lesion, no petechiae and no rash noted. She is not diaphoretic. No cyanosis or erythema. No pallor. Nails show no clubbing.  Psychiatric: She has a normal mood and affect. Her speech is normal and behavior is normal. Judgment and thought content normal. She is not actively hallucinating. Cognition and memory are normal. She is attentive.  Nursing note and vitals reviewed.   Not diaphoretic when NP evaluated patient in exam room but RN reported on initial presentation patient was diaphoretic at her desk  Drank 300ml water took tums and prilosec in clinic then released to eat lunch and return to clinic for re-evaluation in NAD gait sure and steady in hallway  1245 re-evaluation patient feeling much better after lunch.  Medication and food helped to decrease epigastric pain.  Given 3 UD packages tums she may use later today and tomorrow if worsening epigastric pain again.  ER Red flags discussed with patient again by RN Rolly SalterHaley prior to her ambulatory departure in NAD.  Skin warm dry and pink VSS dizzyness resolved A&Ox3 gait sure and steady in hallway BBS CTA PEARRL     Assessment & Plan:  A-epigastric pain  P-?medication related gastritis/ulcer DDx cardiac etiology  Prilosec 20mg  DR po dailly with food x 30 days #90 RF0 dispensed from [facility] to patient.  Tums 2 po TID prn heartburn breakthrough/epigastric pain.  Avoid NSAIDs, alcohol, trigger foods.  Eat regular meals and snacks.  If worsening abdomen pain, syncope, throwing up blood or pooping blood seek re-evaluation same day with clinic/PCM/ER/UC.  Discussed with patient sometimes heart problems can mimic stomach pains and we do not have EKG machine in this clinic to check for heart problems nor stat serum testing all sent out and results next day to [facility].  exitcare handout on epigastric pain and nonspecific chest pain printed and given to patient.  Patient  verbalized understanding information/instructions, agreed with plan of care and had no further questions at this time.  I have recommended clear fluids and bland diet. Avoid dairy/spicy, fried and large portions of meat while having epigastric pain. If vomiting hold po intake x 1 hour. Then sips clear fluids like broths, ginger ale, power ade, gatorade, pedialyte may advance to soft/bland if no vomiting x 24 hours and appetite returned otherwise hydration main focus. Return to the clinic if symptoms persist or worsen; I have alerted the patient to call if high fever, dehydration, marked weakness, fainting, increased abdominal pain, blood in stool or vomit (red or black). Patient verbalized agreement and understanding of treatment plan and had no further questions at this time.

## 2020-02-15 ENCOUNTER — Other Ambulatory Visit: Payer: Self-pay | Admitting: Family Medicine

## 2020-02-15 DIAGNOSIS — M810 Age-related osteoporosis without current pathological fracture: Secondary | ICD-10-CM | POA: Insufficient documentation

## 2020-02-15 DIAGNOSIS — E785 Hyperlipidemia, unspecified: Secondary | ICD-10-CM | POA: Insufficient documentation

## 2020-02-16 ENCOUNTER — Other Ambulatory Visit: Payer: Self-pay | Admitting: Family Medicine

## 2020-02-16 DIAGNOSIS — M810 Age-related osteoporosis without current pathological fracture: Secondary | ICD-10-CM

## 2020-02-16 DIAGNOSIS — Z1231 Encounter for screening mammogram for malignant neoplasm of breast: Secondary | ICD-10-CM

## 2020-05-18 DIAGNOSIS — I10 Essential (primary) hypertension: Secondary | ICD-10-CM | POA: Insufficient documentation

## 2020-05-18 DIAGNOSIS — L601 Onycholysis: Secondary | ICD-10-CM | POA: Insufficient documentation

## 2020-05-30 ENCOUNTER — Ambulatory Visit
Admission: RE | Admit: 2020-05-30 | Discharge: 2020-05-30 | Disposition: A | Discharge status: Home / Self Care | Payer: PRIVATE HEALTH INSURANCE | Source: Ambulatory Visit | Attending: Family Medicine | Admitting: Family Medicine

## 2020-05-30 ENCOUNTER — Other Ambulatory Visit: Payer: Self-pay

## 2020-05-30 DIAGNOSIS — M810 Age-related osteoporosis without current pathological fracture: Secondary | ICD-10-CM

## 2020-05-30 DIAGNOSIS — Z1231 Encounter for screening mammogram for malignant neoplasm of breast: Secondary | ICD-10-CM

## 2020-07-09 ENCOUNTER — Other Ambulatory Visit: Payer: Self-pay

## 2020-07-09 ENCOUNTER — Ambulatory Visit
Admission: RE | Admit: 2020-07-09 | Discharge: 2020-07-09 | Disposition: A | Discharge status: Home / Self Care | Payer: No Typology Code available for payment source | Source: Ambulatory Visit | Attending: Family Medicine | Admitting: Family Medicine

## 2021-02-26 ENCOUNTER — Ambulatory Visit: Payer: Self-pay | Admitting: Registered Nurse

## 2021-02-26 ENCOUNTER — Other Ambulatory Visit: Payer: Self-pay

## 2021-02-26 VITALS — BP 100/70 | HR 80 | Temp 99.4°F

## 2021-02-26 DIAGNOSIS — S46912A Strain of unspecified muscle, fascia and tendon at shoulder and upper arm level, left arm, initial encounter: Secondary | ICD-10-CM

## 2021-02-26 MED ORDER — BIOFREEZE 4 % EX GEL: 1.0000 "application " | Freq: Four times a day (QID) | CUTANEOUS | 0 refills | Status: AC | PRN

## 2021-02-26 MED ORDER — MELOXICAM 15 MG PO TABS: 15.0000 mg | ORAL_TABLET | Freq: Every day | ORAL | 0 refills | Status: DC

## 2021-02-26 NOTE — Progress Notes (Signed)
Subjective:    Patient ID: Tonya Farrell, female    DOB: 1961-02-17, 60 y.o.   MRN: [id number]  60y/o Caucasian established female pt c/o 2-3 days of L shoulder pain. Not increasingly worse just constant. Worsens with certain movements. Restricted to less than 45 deg abduction without pain. No increased pain with gripping or weight. No known acute injury but does help move mother in law at times at home. Works in Nutritional therapist at [employer].  Right hand dominant.  Denied known injury/trauma.  Denied lifting heavy trays of silver typically pieces of flatware  packed.  Denied new exercises/hobbies.  Denied swelling/rash/bruising.  Has used mobic in the past with good relief musculoskeletal pain needs refill.  Would like biofreeze also.  Has reusable ice pack at home.     Review of Systems  Constitutional:  Negative for activity change, appetite change, chills, diaphoresis, fatigue and fever.  HENT:  Negative for trouble swallowing and voice change.   Eyes:  Negative for photophobia and visual disturbance.  Respiratory:  Negative for cough, shortness of breath, wheezing and stridor.   Cardiovascular:  Negative for chest pain.  Gastrointestinal:  Negative for diarrhea and vomiting.  Endocrine: Negative for cold intolerance and heat intolerance.  Genitourinary:  Negative for difficulty urinating.  Musculoskeletal:  Positive for arthralgias and myalgias. Negative for back pain, gait problem, joint swelling, neck pain and neck stiffness.  Skin:  Negative for color change, pallor, rash and wound.  Allergic/Immunologic: Negative for food allergies.  Neurological:  Negative for tremors, seizures, syncope, speech difficulty, weakness, light-headedness, numbness and headaches.  Hematological:  Negative for adenopathy. Does not bruise/bleed easily.  Psychiatric/Behavioral:  Negative for agitation, confusion and sleep disturbance.       Objective:   Physical Exam Vitals and  nursing note reviewed.  Constitutional:      General: She is awake. She is not in acute distress.    Appearance: Normal appearance. She is well-developed and well-groomed. She is obese. She is not ill-appearing, toxic-appearing or diaphoretic.  HENT:     Head: Normocephalic and atraumatic.     Jaw: There is normal jaw occlusion.     Salivary Glands: Right salivary gland is not diffusely enlarged. Left salivary gland is not diffusely enlarged.     Right Ear: Hearing and external ear normal.     Left Ear: Hearing and external ear normal.     Nose: Nose normal. No congestion or rhinorrhea.     Mouth/Throat:     Lips: Pink. No lesions.     Mouth: Mucous membranes are moist.     Pharynx: Oropharynx is clear.  Eyes:     General: Lids are normal. Vision grossly intact. Gaze aligned appropriately. Allergic shiner present. No scleral icterus.       Right eye: No discharge.        Left eye: No discharge.     Extraocular Movements: Extraocular movements intact.     Conjunctiva/sclera: Conjunctivae normal.     Pupils: Pupils are equal, round, and reactive to light.  Neck:     Trachea: Trachea and phonation normal. No tracheal deviation.  Cardiovascular:     Rate and Rhythm: Normal rate and regular rhythm.     Pulses: Normal pulses.          Radial pulses are 2+ on the right side and 2+ on the left side.  Pulmonary:     Effort: Pulmonary effort is normal. No respiratory distress.  Breath sounds: Normal breath sounds and air entry. No stridor, decreased air movement or transmitted upper airway sounds. No decreased breath sounds or wheezing.     Comments: Spoke full sentences without difficulty; no cough observed in exam room Abdominal:     General: Abdomen is flat.  Musculoskeletal:        General: No swelling, tenderness or deformity.     Right shoulder: No swelling, deformity, effusion, laceration, tenderness, bony tenderness or crepitus. Normal range of motion. Normal strength.     Left  shoulder: Crepitus present. No swelling, deformity, effusion, laceration, tenderness or bony tenderness. Decreased range of motion. Normal strength.     Right upper arm: No swelling, edema, deformity, lacerations, tenderness or bony tenderness.     Left upper arm: No swelling, edema, deformity, lacerations, tenderness or bony tenderness.     Right elbow: No swelling, deformity, effusion or lacerations. Normal range of motion.     Left elbow: No swelling, deformity, effusion or lacerations. Normal range of motion.     Right wrist: No swelling, deformity, effusion or lacerations. Normal range of motion.     Left wrist: No swelling, deformity, effusion or lacerations. Normal range of motion.     Right hand: No swelling, deformity, lacerations, tenderness or bony tenderness. Normal range of motion. Normal strength. Normal capillary refill.     Left hand: No swelling, deformity, lacerations, tenderness or bony tenderness. Normal range of motion. Normal strength. Normal capillary refill.       Arms:     Cervical back: Normal range of motion and neck supple. No swelling, edema, deformity, erythema, signs of trauma, lacerations, rigidity, spasms, tenderness or crepitus. No pain with movement or muscular tenderness. Normal range of motion.     Thoracic back: No swelling, edema, deformity, signs of trauma, lacerations, spasms or tenderness. Normal range of motion.     Lumbar back: No swelling, edema, deformity, signs of trauma, lacerations or tenderness.     Right lower leg: No edema.     Left lower leg: No edema.     Comments: Left deltoid pain with external shoulder rotation; internal rotation (hand behind back touch shoulder blade), end external rotation, abduction greater than 90 degrees; negative empty can test, cannot reproduce pain with palpation; no swelling/crepitus/erythema/rash/ecchymosis; bilateral trapezius muscles tight but not TTP/paraspinals c/t-spine and scapular muscles not TTP; strength 4+/5  left shoulder when abduction greater than 90 compared to right  Lymphadenopathy:     Head:     Right side of head: No submandibular or preauricular adenopathy.     Left side of head: No submandibular or preauricular adenopathy.     Cervical: No cervical adenopathy.     Right cervical: No superficial cervical adenopathy.    Left cervical: No superficial cervical adenopathy.  Skin:    General: Skin is warm and dry.     Capillary Refill: Capillary refill takes less than 2 seconds.     Coloration: Skin is not ashen, cyanotic, jaundiced, mottled, pale or sallow.     Findings: No abrasion, abscess, acne, bruising, burn, ecchymosis, erythema, signs of injury, laceration, lesion, petechiae, rash or wound.     Nails: There is no clubbing.  Neurological:     General: No focal deficit present.     Mental Status: She is alert and oriented to person, place, and time. Mental status is at baseline.     GCS: GCS eye subscore is 4. GCS verbal subscore is 5. GCS motor subscore is 6.  Cranial Nerves: Cranial nerves are intact. No cranial nerve deficit, dysarthria or facial asymmetry.     Sensory: Sensation is intact.     Motor: Motor function is intact. No weakness, tremor, atrophy, abnormal muscle tone or seizure activity.     Coordination: Coordination is intact. Coordination normal.     Gait: Gait is intact. Gait normal.     Comments: In/out of chair and on/off exam table without difficulty; gait sure and steady in clinic; bilateral hand grasp equal 5/5  Psychiatric:        Attention and Perception: Attention and perception normal.        Mood and Affect: Mood and affect normal.        Speech: Speech normal.        Behavior: Behavior normal. Behavior is cooperative.        Thought Content: Thought content normal.        Cognition and Memory: Cognition and memory normal.        Judgment: Judgment normal.      Left deltoid pain with AROM especially adduction/abduction/neers/atchley scratch but  unable to palpate a trigger point.  Some crepitus audible with arom but no crepitus with palpation.    Assessment & Plan:   A-left shoulder strain initial visit  P-Mobic 15mg  po daily take with food x2 weeks and then prn use #30 RF0 electronic Rx to her pharmacy of choice.  Biofreeze gel topical QID prn pain. Home stretches demonstrated to patient-e.g. Arm circles, walking up wall, chest stretches, neck AROM, and isometric shoulder exercises. 5 seconds 3 reps twice a day.   Consider physical therapy referral if no improvement with prescribed therapy. Ensure ergonomics correct desk at work  avoid repetitive motions if possible/holding phone/laptop  in hand use desk/stand. Patient was instructed to rest, ice 15 minutes qid prn pain/swelling, and ROM exercises. Activity as tolerated. Follow up in 2 weeks for re-evaluation sooner if symptoms worsen. Exitcare handout on shoulder strain and rehab exercises printed and given to patient.  If patient needs work restrictions will need to see RN for scheduling [facility] appt as contract limitations prohibit me from writing work restrictions in this clinic.   Patient verbalized agreement and understanding of treatment Plan and had no further questions at this time.   P2: Injury Prevention and Fitness.

## 2021-02-26 NOTE — Patient Instructions (Signed)
Shoulder Impingement Syndrome Rehab Ask your health care provider which exercises are safe for you. Do exercises exactly as told by your health care provider and adjust them as directed. It is normal to feel mild stretching, pulling, tightness, or discomfort as you do these exercises. Stop right away if you feel sudden pain or your pain gets worse. Do not begin these exercises until told by your health care provider. Stretching and range-of-motion exercise This exercise warms up your muscles and joints and improves the movement and flexibility of your shoulder. This exercise also helps to relieve pain and stiffness. Passive horizontal adduction In passive adduction, you use your other hand to move the injured arm toward your body. The injured arm does not move on its own. In this movement, your arm is moved across your body in the horizontal plane (horizontal adduction). Sit or stand and pull your left / right elbow across your chest, toward your other shoulder. Stop when you feel a gentle stretch in the back of your shoulder and upper arm. Keep your arm at shoulder height. Keep your arm as close to your body as you comfortably can. Hold for ____5______ seconds. Slowly return to the starting position. Repeat ____3______ times. Complete this exercise ___2_______ times a day. Strengthening exercises These exercises build strength and endurance in your shoulder. Endurance is the ability to use your muscles for a long time, even after they get tired. External rotation, isometric This is an exercise in which you press the back of your wrist against a door frame without moving your shoulder joint (isometric). Stand or sit in a doorway, facing the door frame. Bend your left / right elbow and place the back of your wrist against the door frame. Only the back of your wrist should be touching the frame. Keep your upper arm at your side. Gently press your wrist against the door frame, as if you are trying to  push your arm away from your abdomen (external rotation). Press as hard as you are able without pain. Avoid shrugging your shoulder while you press your wrist against the door frame. Keep your shoulder blade tucked down toward the middle of your back. Hold for _____5_____ seconds. Slowly release the tension, and relax your muscles completely before you repeat the exercise. Repeat _____3_____ times. Complete this exercise ____2______ times a day. Internal rotation, isometric This is an exercise in which you press your palm against a door frame without moving your shoulder joint (isometric). Stand or sit in a doorway, facing the door frame. Bend your left / right elbow and place the palm of your hand against the door frame. Only your palm should be touching the frame. Keep your upper arm at your side. Gently press your hand against the door frame, as if you are trying to push your arm toward your abdomen (internal rotation). Press as hard as you are able without pain. Avoid shrugging your shoulder while you press your hand against the door frame. Keep your shoulder blade tucked down toward the middle of your back. Hold for ____5______ seconds. Slowly release the tension, and relax your muscles completely before you repeat the exercise. Repeat ____3______ times. Complete this exercise ____2______ times a day. Scapular protraction, supine  Lie on your back on a firm surface (supine position). Hold a _____none_____ weight in your left / right hand. Raise your left / right arm straight into the air so your hand is directly above your shoulder joint. Push the weight into the air so  your shoulder (scapula) lifts off the surface that you are lying on. The scapula will push up or forward (protraction). Do not move your head, neck, or back. Hold for ______5____ seconds. Slowly return to the starting position. Let your muscles relax completely before you repeat this exercise. Repeat ___3_______ times.  Complete this exercise ____2______ times a day. Scapular retraction  Sit in a stable chair without armrests, or stand up. Secure an exercise band to a stable object in front of you so the band is at shoulder height. Hold one end of the exercise band in each hand. Your palms should face down. Squeeze your shoulder blades together (retraction) and move your elbows slightly behind you. Do not shrug your shoulders upward while you do this. Hold for ____5______ seconds. Slowly return to the starting position. Repeat ______3____ times. Complete this exercise _______2___ times a day. Shoulder extension  Sit in a stable chair without armrests, or stand up. Secure an exercise band to a stable object in front of you so the band is above shoulder height. Hold one end of the exercise band in each hand. Straighten your elbows and lift your hands up to shoulder height. Squeeze your shoulder blades together and pull your hands down to the sides of your thighs (extension). Stop when your hands are straight down by your sides. Do not let your hands go behind your body. Hold for ___5_______ seconds. Slowly return to the starting position. Repeat ____3______ times. Complete this exercise ____2______ times a day. This information is not intended to replace advice given to you by your health care provider. Make sure you discuss any questions you have with your health care provider. Document Revised: 09/17/2018 Document Reviewed: 06/21/2018 Elsevier Patient Education  2022 [employer] Shoulder Impingement Syndrome Shoulder impingement syndrome is a condition that causes pain when connective tissues (tendons) surrounding the shoulder joint become pinched. These tendons are part of the group of muscles and tissues that help to stabilize the shoulder (rotator cuff). Beneath the rotator cuff is a fluid-filled sac (bursa) that allows the muscles and tendons to glide smoothly. The bursa may become swollen or  irritated (bursitis). Bursitis, swelling in the rotator cuff tendons, or both conditions can decrease how much space is under a bone in the shoulder joint (acromion), resulting in impingement. What are the causes? Shoulder impingement syndrome may be caused by bursitis or swelling of the rotator cuff tendons, which may result from: Repetitive overhead arm movements. Falling onto the shoulder. Weakness in the shoulder muscles. What increases the risk? You may be more likely to develop this condition if you: Play sports that involve throwing, such as baseball. Participate in sports such as tennis, volleyball, and swimming. Work as a Education administrator, Music therapist, or Pharmacologist. Some people are also more likely to develop impingement syndrome because of the shape of their acromion bone. What are the signs or symptoms? The main symptom of this condition is pain on the front or side of the shoulder. The pain may: Get worse when lifting or raising the arm. Get worse at night. Wake you up from sleeping. Feel sharp when the shoulder is moved and then fade to an ache. Other symptoms may include: Tenderness. Stiffness. Inability to raise the arm above shoulder level or behind the body. Weakness. How is this diagnosed? This condition may be diagnosed based on: Your symptoms and medical history. A physical exam. Imaging tests, such as: X-rays. MRI. Ultrasound. How is this treated? This condition may be treated by: Resting  your shoulder and avoiding all activities that cause pain or put stress on the shoulder. Icing your shoulder. NSAIDs to help reduce pain and swelling. One or more injections of medicines to numb the area and reduce inflammation. Physical therapy. Surgery. This may be needed if nonsurgical treatments have not helped. Surgery may involve repairing the rotator cuff, reshaping the acromion, or removing the bursa. Follow these instructions at home: Managing pain, stiffness, and  swelling  If directed, put ice on the injured area. Put ice in a plastic bag. Place a towel between your skin and the bag. Leave the ice on for 20 minutes, 2-3 times a day. Activity Rest and return to your normal activities as told by your health care provider. Ask your health care provider what activities are safe for you. Do exercises as told by your health care provider. General instructions Do not use any products that contain nicotine or tobacco, such as cigarettes, e-cigarettes, and chewing tobacco. These can delay healing. If you need help quitting, ask your health care provider. Ask your health care provider when it is safe for you to drive. Take over-the-counter and prescription medicines only as told by your health care provider. Keep all follow-up visits as told by your health care provider. This is important. How is this prevented? Give your body time to rest between periods of activity. Be safe and responsible while being active. This will help you avoid falls. Maintain physical fitness, including strength and flexibility. Contact a health care provider if: Your symptoms have not improved after 1-2 months of treatment and rest. You cannot lift your arm away from your body. Summary Shoulder impingement syndrome is a condition that causes pain when connective tissues (tendons) surrounding the shoulder joint become pinched. The main symptom of this condition is pain on the front or side of the shoulder. This condition is usually treated with rest, ice, and pain medicines as needed. This information is not intended to replace advice given to you by your health care provider. Make sure you discuss any questions you have with your health care provider. Document Revised: 09/17/2018 Document Reviewed: 11/18/2017 Elsevier Patient Education  2022 [employer].

## 2021-03-25 ENCOUNTER — Other Ambulatory Visit: Payer: Self-pay | Admitting: Registered Nurse

## 2021-03-26 ENCOUNTER — Encounter: Payer: Self-pay | Admitting: Registered Nurse

## 2021-03-26 NOTE — Telephone Encounter (Signed)
Spoke with patient via telephone mobic helped to decrease pain greatly and would like refill sent to [facility]. Electronic Rx mobic 18m po daily #30 RF0 sent to patient pharmacy of choice.  Last seen 02/26/21 left shoulder strain.  Patient notified electronic Rx sent to her pharmacy of choice today.  Patient verbalized understanding information/instructions, agreed with plan of care and had no further questions at this time.   Last labs 02/18/21 Glucose 65 - 99 mg/dL 106 High    BUN 8 - 27 mg/dL 13   Creatinine 0.57 - 1.00 mg/dL 0.50 Low    eGFR >59 mL/min/1.73 107   BUN/Creatinine Ratio 12 - 28 26   Sodium 134 - 144 mmol/L 140   Potassium 3.5 - 5.2 mmol/L 4.1   Chloride 96 - 106 mmol/L 103   CO2 20 - 29 mmol/L 23   CALCIUM 8.7 - 10.3 mg/dL 9.4   Total Protein 6.0 - 8.5 g/dL 6.8   Albumin, Serum 3.8 - 4.9 g/dL 4.5   Globulin, Total 1.5 - 4.5 g/dL 2.3   Albumin/Globulin Ratio 1.2 - 2.2 2.0   Total Bilirubin 0.0 - 1.2 mg/dL 0.3   Alkaline Phosphatase 44 - 121 IU/L 76   AST 0 - 40 IU/L 23   ALT (SGPT) 0 - 32 IU/L 32   Resulting Agency  [facility]  Narrative [facility]  [address] [address] [place] [id number] Lab Director: SRush FarmerMD, Phone:  [phone] Collected: 02/18/21 10:02 Last Resulted: 02/18/21 23:35  Received From: [facility] Result Received: 02/26/21 09:56

## 2021-04-24 ENCOUNTER — Encounter: Payer: Self-pay | Admitting: Registered Nurse

## 2021-04-24 ENCOUNTER — Other Ambulatory Visit: Payer: Self-pay | Admitting: Registered Nurse

## 2021-04-25 NOTE — Telephone Encounter (Signed)
Patient contacted via telephone stated mobic working well for her and would like refill.  Electronic Rx sent meloxicam 15mg  po daily #30 RF0 to her pharmacy of choice.  Patient verbalized understanding information/instructions, agreed with plan of care and had no further questions at this time.

## 2021-05-23 ENCOUNTER — Encounter: Payer: Self-pay | Admitting: Registered Nurse

## 2021-05-23 ENCOUNTER — Other Ambulatory Visit: Payer: Self-pay | Admitting: Registered Nurse

## 2021-05-23 NOTE — Telephone Encounter (Signed)
Patient contacted via telephone and stated she does not need any refills on mobic at this time.  Will contact clinic staff when she does need refill.  Rx refused.

## 2021-07-04 ENCOUNTER — Other Ambulatory Visit: Payer: Self-pay

## 2021-07-04 ENCOUNTER — Ambulatory Visit (INDEPENDENT_AMBULATORY_CARE_PROVIDER_SITE_OTHER): Payer: No Typology Code available for payment source

## 2021-07-04 ENCOUNTER — Ambulatory Visit (HOSPITAL_COMMUNITY)
Admission: EM | Admit: 2021-07-04 | Discharge: 2021-07-04 | Disposition: A | Discharge status: Home / Self Care | Payer: No Typology Code available for payment source | Attending: Internal Medicine | Admitting: Internal Medicine

## 2021-07-04 DIAGNOSIS — R531 Weakness: Secondary | ICD-10-CM | POA: Insufficient documentation

## 2021-07-04 DIAGNOSIS — R42 Dizziness and giddiness: Secondary | ICD-10-CM | POA: Diagnosis present

## 2021-07-04 DIAGNOSIS — R5383 Other fatigue: Secondary | ICD-10-CM

## 2021-07-04 LAB — CBC WITH DIFFERENTIAL/PLATELET
Abs Immature Granulocytes: 0.01 10*3/uL (ref 0.00–0.07)
Basophils Absolute: 0 10*3/uL (ref 0.0–0.1)
Basophils Relative: 1 %
Eosinophils Absolute: 0.1 10*3/uL (ref 0.0–0.5)
Eosinophils Relative: 2 %
HCT: 43.2 % (ref 36.0–46.0)
Hemoglobin: 15.1 g/dL — ABNORMAL HIGH (ref 12.0–15.0)
Immature Granulocytes: 0 %
Lymphocytes Relative: 27 %
Lymphs Abs: 1.6 10*3/uL (ref 0.7–4.0)
MCH: 30.5 pg (ref 26.0–34.0)
MCHC: 35 g/dL (ref 30.0–36.0)
MCV: 87.3 fL (ref 80.0–100.0)
Monocytes Absolute: 0.5 10*3/uL (ref 0.1–1.0)
Monocytes Relative: 9 %
Neutro Abs: 3.7 10*3/uL (ref 1.7–7.7)
Neutrophils Relative %: 61 %
Platelets: 227 10*3/uL (ref 150–400)
RBC: 4.95 MIL/uL (ref 3.87–5.11)
RDW: 12.6 % (ref 11.5–15.5)
WBC: 6 10*3/uL (ref 4.0–10.5)
nRBC: 0 % (ref 0.0–0.2)

## 2021-07-04 LAB — COMPREHENSIVE METABOLIC PANEL
ALT: 60 U/L — ABNORMAL HIGH (ref 0–44)
AST: 46 U/L — ABNORMAL HIGH (ref 15–41)
Albumin: 4.1 g/dL (ref 3.5–5.0)
Alkaline Phosphatase: 56 U/L (ref 38–126)
Anion gap: 7 (ref 5–15)
BUN: 11 mg/dL (ref 6–20)
CO2: 25 mmol/L (ref 22–32)
Calcium: 8.9 mg/dL (ref 8.9–10.3)
Chloride: 105 mmol/L (ref 98–111)
Creatinine, Ser: 0.56 mg/dL (ref 0.44–1.00)
GFR, Estimated: >60 mL/min (ref >60)
Glucose, Bld: 118 mg/dL — ABNORMAL HIGH (ref 70–99)
Potassium: 3.8 mmol/L (ref 3.5–5.1)
Sodium: 137 mmol/L (ref 135–145)
Total Bilirubin: 0.5 mg/dL (ref 0.3–1.2)
Total Protein: 7.1 g/dL (ref 6.5–8.1)

## 2021-07-04 LAB — VITAMIN D 25 HYDROXY (VIT D DEFICIENCY, FRACTURES): Vit D, 25-Hydroxy: 24.79 ng/mL — ABNORMAL LOW (ref 30–100)

## 2021-07-04 LAB — TSH: TSH: 2.313 u[IU]/mL (ref 0.350–4.500)

## 2021-07-04 NOTE — Discharge Instructions (Signed)
Your chest x-ray does not show pneumonia Your blood pressure is normal We will call you with recommendations if your labs are abnormal Return to urgent care if symptoms worsen. If you continue to stay depressed you may need to see a psychiatrist or psychologist for counseling.

## 2021-07-04 NOTE — ED Triage Notes (Signed)
Pt reports knot on the top of the head. She reports having a dizzy spells yesterday after pulling her cart.

## 2021-07-05 NOTE — ED Provider Notes (Signed)
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  Pt reports knot on the top of her head for a couple years. She is feeling pressure from the knot.    HPI Tonya Farrell is a 61 y.o. female comes to the urgent care with 1 day history of dizzy spells with activity.  Patient says symptoms started was at work especially when she is standing from a bent over position.  She denies any loss of consciousness or near loss of consciousness.  She denies falling or hitting her head.  No numbness or tingling.  No difficulty finding her words.  No extremity weakness or facial deviation.  Patient denies any new medications.  No vomiting or diarrhea.  Patient denies any fatigue.  No concerns with blood pressure.  No fever or chills.  No sore throat.  Patient also complains of sebaceous cyst on the scalp.  No changes in size.  No pain.  No redness.  Patient's family member died about a year ago.  She has been feeling depressed regarding the loss of the family member.  No insomnia.  No anhedonia.  No suicidal or homicidal ideation.  Patient is a smoker and smokes about half a pack of cigarettes a day.  No shortness of breath.  Patient has lost about 15 pounds over the past half year. HPI  Past Medical History:  Diagnosis Date   Diabetes mellitus without complication ([place])     Patient Active Problem List   Diagnosis Date Noted   Hypertension 05/18/2020   Onycholysis 05/18/2020   Hyperlipidemia 02/15/2020   Osteoporosis 02/15/2020   Pes anserinus bursitis of left knee 07/16/2017   S/P left knee arthroscopy 03/27/2017   Old peripheral tear of medial meniscus of left knee 02/26/2017   Left knee pain 09/02/2016   Obesity (BMI 30-39.9) 09/02/2016   Tobacco abuse 09/02/2016   Type 2 diabetes mellitus without  complication, without long-term current use of insulin ([place]) 09/02/2016   Prediabetes 09/02/2016    Past Surgical History:  Procedure Laterality Date   FOOT SURGERY Right     OB History   No obstetric history on file.      Home Medications    Prior to Admission medications   Medication Sig Start Date End Date Taking? Authorizing Provider  alendronate (FOSAMAX) 70 MG tablet Take 70 mg by mouth once a week. 01/13/21   [provider]  aspirin EC 325 MG tablet Take 1 tablet by mouth daily. 03/27/17   [provider]  cholecalciferol (VITAMIN D3) 25 MCG (1000 UNIT) tablet Take 1,000 Units by mouth daily.    [provider]  meloxicam (MOBIC) 15 MG tablet TAKE 1 TABLET(15 MG) BY MOUTH DAILY 04/25/21   Betancourt, Aura Fey, NP  pravastatin (PRAVACHOL) 20 MG tablet Take 20 mg by mouth daily.    [provider]    Family History No family history on file.  Social History Social History   Tobacco Use   Smoking status: Every Day    Packs/day: 1.00    Types: Cigarettes   Smokeless tobacco: Never  Substance Use Topics   Alcohol use: No   Drug use: No     Allergies   Patient has no known allergies.   Review of Systems Review  of Systems  Constitutional:  Positive for fatigue.  HENT: Negative.    Gastrointestinal: Negative.   Genitourinary: Negative.   Neurological:  Positive for dizziness and light-headedness. Negative for weakness and headaches.    Physical Exam Triage Vital Signs ED Triage Vitals  Enc Vitals Group     BP 07/04/21 1134 135/80     Pulse --      Resp 07/04/21 1134 16     Temp 07/04/21 1135 97.9 F (36.6 C)     Temp Source 07/04/21 1135 Oral     SpO2 07/04/21 1134 95 %     Weight --      Height --      Head Circumference --      Peak Flow --      Pain Score 07/04/21 1330 0     Pain Loc --      Pain Edu? --      Excl. in [place]? --    No data found.  Updated Vital Signs BP 135/80 (BP Location: Left Arm)    Temp  97.9 F (36.6 C) (Oral)    Resp 16    SpO2 95%   Visual Acuity Right Eye Distance:   Left Eye Distance:   Bilateral Distance:    Right Eye Near:   Left Eye Near:    Bilateral Near:     Physical Exam Vitals and nursing note reviewed.  Constitutional:      General: She is not in acute distress.    Appearance: She is not ill-appearing.  HENT:     Right Ear: Tympanic membrane normal.     Left Ear: Tympanic membrane normal.     Mouth/Throat:     Mouth: Mucous membranes are moist.     Pharynx: No posterior oropharyngeal erythema.  Eyes:     Extraocular Movements: Extraocular movements intact.     Conjunctiva/sclera: Conjunctivae normal.  Cardiovascular:     Rate and Rhythm: Normal rate and regular rhythm.     Pulses: Normal pulses.     Heart sounds: Normal heart sounds.  Pulmonary:     Effort: Pulmonary effort is normal.     Breath sounds: Rhonchi present.  Musculoskeletal:        General: Normal range of motion.  Neurological:     Mental Status: She is alert.     UC Treatments / Results  Labs (all labs ordered are listed, but only abnormal results are displayed) Labs Reviewed  CBC WITH DIFFERENTIAL/PLATELET - Abnormal; Notable for the following components:      Result Value   Hemoglobin 15.1 (*)    All other components within normal limits  COMPREHENSIVE METABOLIC PANEL - Abnormal; Notable for the following components:   Glucose, Bld 118 (*)    AST 46 (*)    ALT 60 (*)    All other components within normal limits  VITAMIN D 25 HYDROXY (VIT D DEFICIENCY, FRACTURES) - Abnormal; Notable for the following components:   Vit D, 25-Hydroxy 24.79 (*)    All other components within normal limits  TSH    EKG   Radiology DG Chest 2 View  Result Date: 07/04/2021 CLINICAL DATA:  Fatigue EXAM: CHEST - 2 VIEW COMPARISON:  None. FINDINGS: The heart size and mediastinal contours are within normal limits. Mild right middle lobe atelectasis. Lungs are otherwise clear. The  visualized skeletal structures are unremarkable. IMPRESSION: No active cardiopulmonary disease. Electronically Signed   By: Yetta Glassman M.D.   On: 07/04/2021 13:04  Procedures Procedures (including critical care time)  Medications Ordered in UC Medications - No data to display  Initial Impression / Assessment and Plan / UC Course  I have reviewed the triage vital signs and the nursing notes.  Pertinent labs & imaging results that were available during my care of the patient were reviewed by me and considered in my medical decision making (see chart for details).     1.  Dizziness with generalized weakness: CBC, CMP TSH, vitamin D level Chest x-ray is negative for acute lung infiltrate. We will call patient with recommendations if labs are abnormal Smoke cessation advice given.  Patient is in the precontemplative state of smoke cessation Time spent on tobacco cessation is less than 10 minutes. Patient is advised to reach out to a psychologist or psychiatrist to help with grief reaction. Final Clinical Impressions(s) / UC Diagnoses   Final diagnoses:  Weakness with dizziness     Discharge Instructions      Your chest x-ray does not show pneumonia Your blood pressure is normal We will call you with recommendations if your labs are abnormal Return to urgent care if symptoms worsen. If you continue to stay depressed you may need to see a psychiatrist or psychologist for counseling.   ED Prescriptions   None    PDMP not reviewed this encounter.   Chase Picket, MD 07/05/21 1537

## 2021-08-19 DIAGNOSIS — R7989 Other specified abnormal findings of blood chemistry: Secondary | ICD-10-CM | POA: Insufficient documentation

## 2021-08-19 DIAGNOSIS — E559 Vitamin D deficiency, unspecified: Secondary | ICD-10-CM | POA: Insufficient documentation

## 2021-11-05 ENCOUNTER — Encounter: Payer: Self-pay | Admitting: Registered Nurse

## 2021-11-05 ENCOUNTER — Other Ambulatory Visit: Payer: Self-pay | Admitting: Registered Nurse

## 2021-11-05 ENCOUNTER — Ambulatory Visit: Payer: Self-pay | Admitting: Registered Nurse

## 2021-11-05 VITALS — BP 115/69 | HR 81

## 2021-11-05 DIAGNOSIS — M25562 Pain in left knee: Secondary | ICD-10-CM

## 2021-11-05 MED ORDER — MELOXICAM 15 MG PO TABS: ORAL_TABLET | ORAL | 2 refills | Status: DC

## 2021-11-05 MED ORDER — ACETAMINOPHEN 500 MG PO TABS: 1000.0000 mg | ORAL_TABLET | Freq: Four times a day (QID) | ORAL | 0 refills | Status: AC | PRN

## 2021-11-05 NOTE — Progress Notes (Signed)
Subjective:    Patient ID: Tonya Farrell, female    DOB: June 11, 1960, 61 y.o.   MRN: [id number]  61y/o caucasian female established patient here for left knee pain worsening "bad" on Saturday 5/27.  Denied trauma/falls/injury.  History of meniscal surgery left knee greater than 10 years ago per patient.  Pain not the same.  Back of knee left pain.  Normal range of motion  Denied bruising/rash.  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  Ran out of mobic and hasn't needed it.  Hasn't been taking glucosamine either.     Review of Systems  Constitutional:  Negative for activity change, appetite change, chills, diaphoresis, fatigue and fever.  HENT:  Negative for trouble swallowing and voice change.   Eyes:  Negative for photophobia and visual disturbance.  Respiratory:  Negative for cough, shortness of breath, wheezing and stridor.   Cardiovascular:  Negative for leg swelling.  Gastrointestinal:  Negative for diarrhea, nausea and vomiting.  Endocrine: Negative for cold intolerance and heat intolerance.  Genitourinary:  Negative for difficulty urinating.  Musculoskeletal:  Positive for arthralgias, joint swelling and myalgias. Negative for back pain, gait problem, neck pain and neck stiffness.  Skin:  Negative for color change, pallor, rash and wound.  Allergic/Immunologic: Negative for food allergies.  Neurological:  Negative for dizziness, tremors, seizures, syncope, facial asymmetry, speech difficulty, weakness, light-headedness, numbness and headaches.  Hematological:  Negative for adenopathy. Does not bruise/bleed easily.  Psychiatric/Behavioral:  Negative for agitation, confusion and sleep disturbance.       Objective:   Physical Exam Vitals and nursing note reviewed.  Constitutional:      General: She is awake. She is not in acute distress.    Appearance: Normal appearance. She is well-developed, well-groomed and overweight. She is not ill-appearing,  toxic-appearing or diaphoretic.  HENT:     Head: Normocephalic and atraumatic.     Jaw: There is normal jaw occlusion.     Salivary Glands: Right salivary gland is not diffusely enlarged. Left salivary gland is not diffusely enlarged.     Right Ear: Hearing and external ear normal.     Left Ear: Hearing and external ear normal.     Nose: Nose normal. No congestion or rhinorrhea.     Mouth/Throat:     Lips: Pink. No lesions.     Mouth: Mucous membranes are moist.     Pharynx: Oropharynx is clear.  Eyes:     General: Lids are normal. Vision grossly intact. Gaze aligned appropriately. No visual field deficit or scleral icterus.       Right eye: No discharge.        Left eye: No discharge.     Extraocular Movements: Extraocular movements intact.     Conjunctiva/sclera: Conjunctivae normal.     Pupils: Pupils are equal, round, and reactive to light.  Neck:     Trachea: Trachea and phonation normal. No tracheal deviation.  Cardiovascular:     Rate and Rhythm: Normal rate and regular rhythm.     Pulses:          Radial pulses are 2+ on the right side and 2+ on the left side.  Pulmonary:     Effort: Pulmonary effort is normal.     Breath sounds: Normal breath sounds and air entry. No stridor or transmitted upper airway sounds. No wheezing.     Comments: Spoke full sentences without difficulty; no cough observed in exam room Abdominal:     General:  Abdomen is flat.  Musculoskeletal:        General: Normal range of motion.     Cervical back: Normal range of motion and neck supple. No swelling, edema, deformity, erythema, signs of trauma, lacerations, rigidity, spasms, torticollis, tenderness, bony tenderness or crepitus. Normal range of motion.     Thoracic back: No swelling, edema, deformity, signs of trauma, lacerations or spasms. Normal range of motion.     Lumbar back: No swelling, edema, deformity, signs of trauma, lacerations, spasms, tenderness or bony tenderness. Normal range of  motion. No scoliosis.     Right upper leg: No swelling, edema, deformity, lacerations, tenderness or bony tenderness.     Left upper leg: No swelling, edema, deformity, lacerations, tenderness or bony tenderness.     Right knee: No swelling, deformity, effusion, erythema, ecchymosis, lacerations, bony tenderness or crepitus. Normal range of motion. No tenderness. No medial joint line, lateral joint line, MCL, LCL, ACL, PCL or patellar tendon tenderness. No LCL laxity, MCL laxity, ACL laxity or PCL laxity. Normal alignment, normal meniscus and normal patellar mobility. Normal pulse.     Left knee: Swelling, deformity and effusion present. No erythema, ecchymosis, lacerations, bony tenderness or crepitus. Normal range of motion. Tenderness present over the patellar tendon. No medial joint line, lateral joint line, MCL, LCL, ACL or PCL tenderness. No LCL laxity, MCL laxity, ACL laxity or PCL laxity.Normal alignment and normal patellar mobility. Normal pulse.     Right lower leg: No swelling, deformity, lacerations, tenderness or bony tenderness. No edema.     Left lower leg: Swelling and tenderness present. No deformity, lacerations or bony tenderness. 1+ Edema present.     Right ankle: No swelling, deformity, ecchymosis or lacerations. No tenderness. Normal range of motion. Anterior drawer test negative. Normal pulse.     Left ankle: No swelling, deformity, ecchymosis or lacerations. No tenderness. Normal range of motion. Anterior drawer test negative.       Legs:     Comments: Negative valgus/varus stress/lachmann's/ant/post drawer tests; pain to palpation posterior fossa (feels full compared to right also) and distal to patella medially left only; circumference left knee 16.25 inches over jeans and right 15.5" over jeans; joint line medial or lateral not TTP and no patellar apprehension with direct pressure or displacement; arom equal bilateral knees but slower flexion left; no crepitus palpated or  audible knees with flexion/extension; 0-1+/4 nonpitting edema left  Lymphadenopathy:     Head:     Right side of head: No submandibular or preauricular adenopathy.     Left side of head: No submandibular or preauricular adenopathy.     Cervical:     Right cervical: No superficial cervical adenopathy.    Left cervical: No superficial cervical adenopathy.  Skin:    General: Skin is warm and dry.     Capillary Refill: Capillary refill takes less than 2 seconds.     Coloration: Skin is not ashen, cyanotic, jaundiced, mottled, pale or sallow.     Findings: No abrasion, abscess, acne, bruising, burn, ecchymosis, erythema, signs of injury, laceration, lesion, petechiae, rash or wound.     Nails: There is no clubbing.  Neurological:     General: No focal deficit present.     Mental Status: She is alert and oriented to person, place, and time. Mental status is at baseline.     GCS: GCS eye subscore is 4. GCS verbal subscore is 5. GCS motor subscore is 6.     Cranial Nerves: Cranial nerves  2-12 are intact. No cranial nerve deficit, dysarthria or facial asymmetry.     Sensory: Sensation is intact.     Motor: Motor function is intact. No weakness, tremor, atrophy, abnormal muscle tone or seizure activity.     Coordination: Coordination is intact. Coordination normal.     Gait: Gait is intact. Gait normal.     Comments: In/out of chair without difficulty; gait sure and steady in clinic; bilateral hand grasp equal 5/5  Psychiatric:        Attention and Perception: Attention and perception normal.        Mood and Affect: Mood and affect normal.        Speech: Speech normal.        Behavior: Behavior normal. Behavior is cooperative.        Thought Content: Thought content normal.        Cognition and Memory: Cognition and memory normal.        Judgment: Judgment normal.      Latest Reference Range & Units 07/04/21 12:06  Sodium 135 - 145 mmol/L 137  Potassium 3.5 - 5.1 mmol/L 3.8  Chloride 98 -  111 mmol/L 105  CO2 22 - 32 mmol/L 25  Glucose 70 - 99 mg/dL 161118 (H)  BUN 6 - 20 mg/dL 11  Creatinine 0.960.44 - 0.451.00 mg/dL 4.090.56  Calcium 8.9 - 81.110.3 mg/dL 8.9  Anion gap 5 - 15  7  Alkaline Phosphatase 38 - 126 U/L 56  Albumin 3.5 - 5.0 g/dL 4.1  AST 15 - 41 U/L 46 (H)  ALT 0 - 44 U/L 60 (H)  Total Protein 6.5 - 8.1 g/dL 7.1  Total Bilirubin 0.3 - 1.2 mg/dL 0.5  GFR, Estimated >91>60 mL/min >60  Vitamin D, 25-Hydroxy 30 - 100 ng/mL 24.79 (L)  WBC 4.0 - 10.5 K/uL 6.0  RBC 3.87 - 5.11 MIL/uL 4.95  Hemoglobin 12.0 - 15.0 g/dL 47.815.1 (H)  HCT 29.536.0 - 62.146.0 % 43.2  MCV 80.0 - 100.0 fL 87.3  MCH 26.0 - 34.0 pg 30.5  MCHC 30.0 - 36.0 g/dL 30.835.0  RDW 65.711.5 - 84.615.5 % 12.6  Platelets 150 - 400 K/uL 227  nRBC 0.0 - 0.2 % 0.0  Neutrophils % 61  Lymphocytes % 27  Monocytes Relative % 9  Eosinophil % 2  Basophil % 1  Immature Granulocytes % 0  NEUT# 1.7 - 7.7 K/uL 3.7  Lymphocyte # 0.7 - 4.0 K/uL 1.6  Monocyte # 0.1 - 1.0 K/uL 0.5  Eosinophils Absolute 0.0 - 0.5 K/uL 0.1  Basophils Absolute 0.0 - 0.1 K/uL 0.0  Abs Immature Granulocytes 0.00 - 0.07 K/uL 0.01  (H): Data is abnormally high (L): Data is abnormally low  Dexa scan 2021 osteoporosis results reviewed in Epic  Left knee MRI 2018 IMPRESSION:  1.  Complex tear of the medial meniscus, as above.  2.  Free edge fraying of the body of the lateral meniscus.  3.  Edema superficial and deep to the MCL is favored to be reactive as opposed to representative of grade 1 sprain.  4.  The cruciate ligaments are intact.  5.  Moderate insertional quadriceps and patellar tendinosis.  6.  Low-grade tricompartmental chondrosis.  7.  Small knee joint effusion.  8.  Moderate Baker cyst.   Electronically Signed by: Maurene CapesJared J Meyer  Patient established with Elinor Dodgehristopher Brumfield [facility]     Assessment & Plan:   A-acute knee pain left  P-Suspect worsening of baker's cyst but cannot  rule out meniscal/arthritis flare and distal medial patellar  tendinosis flare.  Fitted and distributed neoprene knee support from clinic stock medium. Discussed may hand wash do not put in washing machine or dryer.  Air dry.   Unable to give work restrictions in this clinic due to contract limitations.  Patient notified would need to see PCM/orthopedics or worker's comp clinic for restrictions note for work.  WCC to be scheduled by RN Rolly Salter if needed. Patient was instructed to rest, ice and elevate leg. Medications as directed. Patient may take NSAIDS as needed  Mobic has worked well in the past 15mg  po daily #30 RF2 electronic Rx to her pharmacy of choice.  Tylenol 1000mg  po q6h prn pain given 2 UD from clinic stock.. Call or return to clinic as needed if these symptoms worsen or fail to improve as anticipated. Patient refused crutches at this time.  Patient verbalized agreement and understanding of treatment plan and had no further questions at this time.  Exitcare handout on knee pain and bakers cyst. P2: ROM exercises

## 2021-11-05 NOTE — Patient Instructions (Addendum)
Baker Cyst  A Baker cyst, also called a popliteal cyst, is a growth that forms at the back of the knee. The cyst forms when the fluid-filled sac (bursa) that cushions the knee joint becomes enlarged. What are the causes? In most cases, a Baker cyst results from another knee problem that causes swelling inside the knee. This makes the fluid inside the knee joint (synovial fluid) flow into the bursa behind the knee, causing the bursa to enlarge. What increases the risk? You may be more likely to develop a Baker cyst if you already have a knee problem, such as: A tear in cartilage that cushions the knee joint (meniscal tear). A tear in the tissues that connect the bones of the knee joint (ligament tear). Knee swelling from osteoarthritis, rheumatoid arthritis, or gout. What are the signs or symptoms? The main symptom of this condition is a lump behind the knee. This may be the only symptom of the condition. The lump may be painful, especially when the knee is straightened. If the lump is painful, the pain may come and go. The knee may also be stiff. Symptoms may quickly get more severe if the cyst breaks open (ruptures). If the cyst ruptures, you may feel the following in your knee and calf: Sudden or worsening pain. Swelling. Bruising. Redness in the calf. A Baker cyst does not always cause symptoms. How is this diagnosed? This condition may be diagnosed based on your symptoms and medical history. Your health care provider will also do a physical exam. This may include: Feeling the cyst to check whether it is tender. Checking your knee for signs of another knee condition that causes swelling. You may have imaging tests, such as: X-rays. MRI. Ultrasound. How is this treated? A Baker cyst that is not painful may go away without treatment. If the cyst gets large or painful, it will likely get better if the underlying knee problem is treated. If needed, treatment for a Baker cyst may  include: Resting. Keeping weight off of the knee. This means not leaning on the knee to support your body weight. Taking NSAIDs, such as ibuprofen, to reduce pain and swelling. Having a procedure to drain the fluid from the cyst with a needle (aspiration). You may also get an injection of a medicine that reduces swelling (steroid). Having surgery. This may be needed if other treatments do not work. This usually involves correcting knee damage and removing the cyst. Follow these instructions at home:  Activity Rest as told by your health care provider. Avoid activities that make pain or swelling worse. Return to your normal activities as told by your health care provider. Ask your health care provider what activities are safe for you. Do not use the injured limb to support your body weight until your health care provider says that you can. Use crutches as told by your health care provider. General instructions Take over-the-counter and prescription medicines only as told by your health care provider. Keep all follow-up visits as told by your health care provider. This is important. Contact a health care provider if: You have knee pain, stiffness, or swelling that does not get better. Get help right away if: You have sudden or worsening pain and swelling in your calf area. Summary A Baker cyst, also called a popliteal cyst, is a growth that forms at the back of the knee. In most cases, a Baker cyst results from another knee problem that causes swelling inside the knee. A Baker cyst that is   not painful may go away without treatment. If needed, treatment for a Baker cyst may include resting, keeping weight off of the knee, medicines, or draining fluid from the cyst. Surgery may be needed if other treatments are not effective. This information is not intended to replace advice given to you by your health care provider. Make sure you discuss any questions you have with your health care  provider. Document Revised: 10/08/2018 Document Reviewed: 10/08/2018 Elsevier Patient Education  2023 [employer] Acute Knee Pain, Adult Many things can cause knee pain. Sometimes, knee pain is sudden (acute) and may be caused by damage, swelling, or irritation of the muscles and tissues that support your knee. The pain often goes away on its own with time and rest. If the pain does not go away, tests may be done to find out what is causing the pain. Follow these instructions at home: If you have a knee sleeve or brace:  Wear the knee sleeve or brace as told by your doctor. Take it off only as told by your doctor. Loosen it if your toes: Tingle. Become numb. Turn cold and blue. Keep it clean. If the knee sleeve or brace is not waterproof: Do not let it get wet. Cover it with a watertight covering when you take a bath or shower. Activity Rest your knee. Do not do things that cause pain or make pain worse. Avoid activities where both feet leave the ground at the same time (high-impact activities). Examples are running, jumping rope, and doing jumping jacks. Work with a physical therapist to make a safe exercise program, as told by your doctor. Managing pain, stiffness, and swelling  If told, put ice on the knee. To do this: If you have a removable knee sleeve or brace, take it off as told by your doctor. Put ice in a plastic bag. Place a towel between your skin and the bag. Leave the ice on for 20 minutes, 2-3 times a day. Take off the ice if your skin turns bright red. This is very important. If you cannot feel pain, heat, or cold, you have a greater risk of damage to the area. If told, use an elastic bandage to put pressure (compression) on your injured knee. Raise your knee above the level of your heart while you are sitting or lying down. Sleep with a pillow under your knee. General instructions Take over-the-counter and prescription medicines only as told by your doctor. Do  not smoke or use any products that contain nicotine or tobacco. If you need help quitting, ask your doctor. If you are overweight, work with your doctor and a food expert (dietitian) to set goals to lose weight. Being overweight can make your knee hurt more. Watch for any changes in your symptoms. Keep all follow-up visits. Contact a doctor if: The knee pain does not stop. The knee pain changes or gets worse. You have a fever along with knee pain. Your knee is red or feels warm when you touch it. Your knee gives out or locks up. Get help right away if: Your knee swells, and the swelling gets worse. You cannot move your knee. You have very bad knee pain that does not get better with pain medicine. Summary Many things can cause knee pain. The pain often goes away on its own with time and rest. Your doctor may do tests to find out the cause of the pain. Watch for any changes in your symptoms. Relieve your pain with rest, medicines, light  activity, and use of ice. Get help right away if you cannot move your knee or your knee pain is very bad. This information is not intended to replace advice given to you by your health care provider. Make sure you discuss any questions you have with your health care provider. Document Revised: 11/09/2019 Document Reviewed: 11/09/2019 Elsevier Patient Education  2023 [employer].

## 2022-05-05 ENCOUNTER — Telehealth: Payer: Self-pay | Admitting: Registered Nurse

## 2022-05-05 ENCOUNTER — Encounter: Payer: Self-pay | Admitting: Registered Nurse

## 2022-05-05 ENCOUNTER — Ambulatory Visit: Payer: No Typology Code available for payment source | Admitting: Occupational Medicine

## 2022-05-05 DIAGNOSIS — M79645 Pain in left finger(s): Secondary | ICD-10-CM

## 2022-05-05 MED ORDER — ACETAMINOPHEN 500 MG PO TABS: 1000.0000 mg | ORAL_TABLET | Freq: Four times a day (QID) | ORAL | 0 refills | Status: AC | PRN

## 2022-05-05 MED ORDER — MELOXICAM 15 MG PO TABS: ORAL_TABLET | ORAL | 2 refills | Status: DC

## 2022-05-05 NOTE — Telephone Encounter (Signed)
See RN Bess Kinds note for 05/05/22 also

## 2022-05-05 NOTE — Progress Notes (Signed)
Patient reports Left thumb pain more towards the palm of hand.  Pain on movement and touch 5-10. Reports hears thumb pop at the joint. She states pain going on for two to three days. Try Voltaren gel no relieve. Patient reports not currently taking mobic. Requesting for new script. Called [facility] NP for recommendations. Recommendations: Apply Thumb splint, restart mobic, and take tylenol today til gets prescription filled. If unable to work to come back to clinic to discuss worker comp. Scheduled follow up with [facility] NP tomorrow. Pt given 1000mg  of tylenol in clinic and applied Left Thumb deluxe stabilizer (L/XL) Noted splint tolerated well and good cap refill.

## 2022-05-05 NOTE — Telephone Encounter (Signed)
Patient came to clinic to see RN Kimrey left thumb pain not responding to rest and voltaren gel this weekend. Stop voltaren gel.  Do not take motrin/advil/aleve/naproxen/naprosyn/diclofenac or voltaren while taking mobic/meloxicam. Left hand dominant.  Has used mobic 15mg  po daily previously ran out would like refill. Discussed may take tylenol 1000mg  po q6h prn pain OTC until she can pick up mobic 15mg  po daily #03 RF2 from her pharmacy of choice electronic Rx sent.  Patient to have wrist thumb spica splint or thumb splint fitted and distributed from clinic stock by RN Kimrey to patient today.  Appt to be booked with me tomorrow if work restrictions not needed.  If work restrictions needed patient to be booked appt with [facility] provider.  Patient and RN verbalized understanding information/instructions and had no further questions at this time.  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

## 2022-05-06 ENCOUNTER — Encounter: Payer: Self-pay | Admitting: Registered Nurse

## 2022-05-06 ENCOUNTER — Ambulatory Visit: Payer: No Typology Code available for payment source | Admitting: Registered Nurse

## 2022-05-06 DIAGNOSIS — M79645 Pain in left finger(s): Secondary | ICD-10-CM

## 2022-05-06 MED ORDER — BIOFREEZE 4 % EX GEL: 1.0000 | Freq: Four times a day (QID) | CUTANEOUS | 0 refills | Status: AC | PRN

## 2022-05-06 NOTE — Progress Notes (Signed)
Subjective:    Patient ID: Tonya Farrell, female    DOB: 1960-12-27, 61 y.o.   MRN: [id number]  61y/o caucasian female established patient here for re-evaluation left thumb pain after started splint/mobic 15mg /biofreeze yesterday.  Denied known trauma/injury.  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  Denied swelling/bruising/weakness.  Pain with pressing over anterior MCP joint of thumb but denied pain with arom without resistance. Pain 2-3/10 intermittent with palpation/lifting items that put pressure on thumb affected area      Review of Systems  Constitutional:  Negative for activity change, appetite change, chills, diaphoresis, fatigue and fever.  HENT:  Negative for trouble swallowing and voice change.   Respiratory:  Negative for cough.   Cardiovascular:  Negative for chest pain.  Gastrointestinal:  Negative for diarrhea, nausea and vomiting.  Genitourinary:  Negative for difficulty urinating.  Musculoskeletal:  Positive for arthralgias. Negative for myalgias, neck pain and neck stiffness.  Skin:  Negative for rash.  Neurological:  Negative for tremors, seizures, syncope, weakness and numbness.  Hematological:  Negative for adenopathy. Does not bruise/bleed easily.  Psychiatric/Behavioral:  Negative for agitation, confusion and sleep disturbance.        Objective:   Physical Exam Vitals and nursing note reviewed.  Constitutional:      General: She is awake. She is not in acute distress.    Appearance: Normal appearance. She is well-developed, well-groomed and overweight. She is not ill-appearing, toxic-appearing or diaphoretic.  HENT:     Head: Normocephalic and atraumatic.     Jaw: There is normal jaw occlusion.     Salivary Glands: Right salivary gland is not diffusely enlarged. Left salivary gland is not diffusely enlarged.     Right Ear: Hearing and external ear normal.     Left Ear: Hearing and external ear normal.     Nose: Nose normal. No  congestion or rhinorrhea.     Mouth/Throat:     Lips: Pink. No lesions.     Mouth: Mucous membranes are moist. No oral lesions.     Dentition: No gum lesions.     Tongue: No lesions.     Pharynx: Oropharynx is clear.  Eyes:     General: Lids are normal. Vision grossly intact. Gaze aligned appropriately. Allergic shiner present. No scleral icterus.       Right eye: No discharge.        Left eye: No discharge.     Extraocular Movements: Extraocular movements intact.     Conjunctiva/sclera: Conjunctivae normal.     Pupils: Pupils are equal, round, and reactive to light.  Neck:     Trachea: Trachea and phonation normal. No abnormal tracheal secretions or tracheal deviation.  Cardiovascular:     Rate and Rhythm: Normal rate and regular rhythm.     Pulses:          Radial pulses are 2+ on the right side and 2+ on the left side.  Pulmonary:     Effort: Pulmonary effort is normal.     Breath sounds: Normal breath sounds and air entry. No stridor or transmitted upper airway sounds. No wheezing.     Comments: Spoke full sentences without difficulty; no cough observed in exam room Abdominal:     General: Abdomen is flat.  Musculoskeletal:        General: Normal range of motion.     Right elbow: No swelling, deformity, effusion or lacerations. Normal range of motion.     Left elbow: No  swelling, deformity, effusion or lacerations. Normal range of motion.     Right forearm: No swelling, edema, deformity, lacerations, tenderness or bony tenderness.     Left forearm: No swelling, edema, deformity, lacerations, tenderness or bony tenderness.     Right wrist: No swelling, deformity, effusion, lacerations, tenderness, bony tenderness, snuff box tenderness or crepitus. Normal range of motion. Normal pulse.     Left wrist: No swelling, deformity, effusion, lacerations, tenderness, bony tenderness, snuff box tenderness or crepitus. Normal range of motion. Normal pulse.     Right hand: No swelling,  deformity, lacerations, tenderness or bony tenderness. Normal range of motion. Normal strength. Normal sensation. Normal capillary refill. Normal pulse.     Left hand: Tenderness present. No swelling, deformity, lacerations or bony tenderness. Normal range of motion. Normal strength. Normal sensation. Normal capillary refill. Normal pulse.       Arms:     Cervical back: Normal range of motion and neck supple. No edema, erythema, signs of trauma, rigidity, torticollis or crepitus. No pain with movement. Normal range of motion.     Comments: Tendon insertion site anterior left MCP joint distal slightly TTP; removed soft left thumb splint for exam and patient replaced after exam; skin warm dry and pink no erythema/ecchymosis/crepitus with palpation or AROM; normal abduction/adduction/opposition fingers bilateral hands  Lymphadenopathy:     Head:     Right side of head: No submandibular or preauricular adenopathy.     Left side of head: No submandibular or preauricular adenopathy.     Cervical:     Right cervical: No superficial cervical adenopathy.    Left cervical: No superficial cervical adenopathy.  Skin:    General: Skin is warm and dry.     Capillary Refill: Capillary refill takes less than 2 seconds.     Coloration: Skin is not ashen, cyanotic, jaundiced, mottled, pale or sallow.     Findings: No abrasion, abscess, acne, bruising, burn, ecchymosis, erythema, signs of injury, laceration, lesion, petechiae, rash or wound.     Nails: There is no clubbing.  Neurological:     General: No focal deficit present.     Mental Status: She is alert and oriented to person, place, and time. Mental status is at baseline.     GCS: GCS eye subscore is 4. GCS verbal subscore is 5. GCS motor subscore is 6.     Cranial Nerves: Cranial nerves 2-12 are intact. No cranial nerve deficit, dysarthria or facial asymmetry.     Sensory: Sensation is intact.     Motor: Motor function is intact. No weakness, tremor,  atrophy, abnormal muscle tone or seizure activity.     Coordination: Coordination is intact. Coordination normal.     Gait: Gait is intact. Gait normal.     Comments: In/out of chair without difficulty; gait sure and steady in clinic; bilateral hand grasp equal 5/5  Psychiatric:        Attention and Perception: Attention and perception normal.        Mood and Affect: Mood and affect normal.        Speech: Speech normal.        Behavior: Behavior normal. Behavior is cooperative.        Thought Content: Thought content normal.        Cognition and Memory: Cognition and memory normal.        Judgment: Judgment normal.       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  Trial splint thumb left x 2 weeks.  Consider epsom salt soaks daily hot left hand after work and if worsening symptoms then switch to ice 10-15 minutes after work.  Continue mobic 15mg  po daily take with food. Avoid other nsaids/topical diclofenac while taking mobic. If unable to perform work duties will need to be scheduled with [facility] see RN Kimrey to schedule.  Patient does not want work restrictions at this time stated able to perform work duties without difficulty.  Follow up re-evaluation 2 weeks sooner if new or worsening symptoms.  Exitcare handout on dequervains tenosynovitis and thumb sprain printed and given to patient.  Patient verbalized understanding information/instructions, agreed with plan of care and had no further questions at this time.

## 2022-05-06 NOTE — Patient Instructions (Signed)
De Quervain's Tenosynovitis  De Quervain's tenosynovitis is a condition that causes inflammation of the tendon on the thumb side of the wrist. Tendons are cords of tissue that connect bones to muscles. The tendons in the hand pass through a tunnel called a sheath. A slippery layer of tissue (synovium) lets the tendons move smoothly in the sheath. With de Quervain's tenosynovitis, the sheath swells or thickens, causing friction and pain. The condition is also called de Quervain's disease and de Quervain's syndrome. It occurs most often in women who are 30-50 years old. What are the causes? The exact cause of this condition is not known. It may be associated with overuse of the hand and wrist. What increases the risk? You are more likely to develop this condition if you: Use your hands far more than normal, especially if you repeat certain movements that involve twisting your hand or using a tight grip. Are pregnant. Are a middle-aged woman. Have rheumatoid arthritis. Have diabetes. What are the signs or symptoms? The main symptom of this condition is pain on the thumb side of the wrist. The pain may get worse when you grasp something or turn your wrist. Other symptoms may include: Pain that extends up the forearm. Swelling of your wrist and hand. Trouble moving the thumb and wrist. A sensation of snapping in the wrist. A bump filled with fluid (cyst) in the area of the pain. How is this diagnosed? This condition may be diagnosed based on: Your symptoms and medical history. A physical exam. During the exam, your health care provider may do a simple test (Finkelstein test) that involves pulling your thumb and wrist to see if this causes pain. You may also need to have an X-ray or ultrasound. How is this treated? Treatment for this condition may include: Avoiding any activity that causes pain and swelling. Taking medicines. Anti-inflammatory medicines and corticosteroid injections may be used  to reduce inflammation and relieve pain. Wearing a splint. Having surgery. This may be needed if other treatments do not work. Once the pain and swelling have gone down, you may start: Physical therapy. This includes exercises to improve movement and strength in your wrist and thumb. Occupational therapy. This includes adjusting how you move your wrist. Follow these instructions at home: If you have a splint: Wear the splint as told by your health care provider. Remove it only as told by your health care provider. Loosen the splint if your fingers tingle, become numb, or turn cold and blue. Keep the splint clean. If the splint is not waterproof: Do not let it get wet. Cover it with a watertight covering when you take a bath or a shower. Managing pain, stiffness, and swelling  Avoid movements and activities that cause pain and swelling in the wrist area. If directed, put ice on the painful area. This may be helpful after doing activities that involve the sore wrist. To do this: Put ice in a plastic bag. Place a towel between your skin and the bag. Leave the ice on for 20 minutes, 2-3 times a day. Remove the ice if your skin turns bright red. This is very important. If you cannot feel pain, heat, or cold, you have a greater risk of damage to the area. Move your fingers often to reduce stiffness and swelling. Raise (elevate) the injured area above the level of your heart while you are sitting or lying down. General instructions Return to your normal activities as told by your health care provider. Ask your   health care provider what activities are safe for you. Take over-the-counter and prescription medicines only as told by your health care provider. Keep all follow-up visits. This is important. Contact a health care provider if: Your pain medicine does not help. Your pain gets worse. You develop new symptoms. Summary De Quervain's tenosynovitis is a condition that causes inflammation  of the tendon on the thumb side of the wrist. The condition occurs most often in women who are 67-31 years old. The exact cause of this condition is not known. It may be associated with overuse of the hand and wrist. Treatment starts with avoiding activity that causes pain or swelling in the wrist area. Other treatments may include wearing a splint and taking medicine. Sometimes, surgery is needed. This information is not intended to replace advice given to you by your health care provider. Make sure you discuss any questions you have with your health care provider. Document Revised: 09/07/2019 Document Reviewed: 09/07/2019 Elsevier Patient Education  2023 [employer] Thumb Sprain  A thumb sprain is an injury to one of the bands of tissue that connect bones to each other (a ligament) in your thumb. The ligament may be stretched too much, or it may be torn. A tear can be either partial or complete. How bad, or severe, the sprain is depends on how much of the ligament was damaged or torn. What are the causes? A thumb sprain is often caused by a fall or an accident, such as when you hold your hands out to catch something or to protect yourself. What increases the risk? This injury is more likely to occur in people who play sports that involve: A risk of falling, such as skiing. Catching an object, such as basketball. What are the signs or symptoms? Symptoms of this condition include: Not being able to move the thumb normally. Swelling. Tenderness. Bruising. How is this diagnosed? This condition may be diagnosed based on: Your symptoms and medical history. Your health care provider may ask about any recent injuries to your thumb. A physical exam. Imaging studies, such as X-rays, ultrasound, or MRI. How is this treated? Treatment for this condition depends on how severe your sprain is. If your ligament is overstretched or partially torn, treatment usually involves keeping your thumb in a  fixed position (immobilization) for at least 4 to 6 weeks. Your health care provider will apply a bandage (dressing), splint, brace, or cast to keep your thumb from moving until it heals. If your ligament is fully torn, you may need surgery to reconnect the ligament to the bone. After surgery, you will need to wear a cast or splint on your thumb. Your health care provider may also recommend physical therapy to strengthen your thumb. Follow these instructions at home: If you have a removable splint, bandage, or brace: Wear the splint, bandage, or brace as told by your health care provider. Remove it only as told by your health care provider. Check the skin around the splint, bandage, or brace every day. Tell your health care provider about any concerns. Loosen the splint, bandage, or brace if your thumb or fingers tingle, become numb, or turn cold and blue. Keep it clean and dry. If you have a nonremovable cast: Do not put pressure on any part of the cast until it is fully hardened. This may take several hours. Do not stick anything inside the cast to scratch your skin. Doing that increases your risk of infection. Check the skin around the cast every  day. Tell your health care provider about any concerns. You may put lotion on dry skin around the edges of the cast. Do not put lotion on the skin underneath the cast. Keep it clean and dry. Bathing Do not take baths, swim, or use a hot tub until your health care provider approves. Ask your health care provider if you may take showers. You may only be allowed to take sponge baths. If your splint, bandage, brace, or cast is not waterproof: Do not let it get wet. Cover it with a watertight covering when you take a bath or shower. Managing pain, stiffness, and swelling  If directed, put ice on your thumb. To do this: If you have a removable splint, bandage, or brace, remove it as told by your health care provider. Put ice in a plastic bag. Place a  towel between your skin and the bag, or between your cast and the bag. Leave the ice on for 20 minutes, 2-3 times a day. Remove the ice if your skin turns bright red. This is very important. If you cannot feel pain, heat, or cold, you have a greater risk of damage to the area. Move your fingers often to reduce stiffness and swelling. Raise (elevate) the injured area above the level of your heart while you are sitting or lying down. Activity Return to your normal activities as told by your health care provider. Ask your health care provider what activities are safe for you. Do physical therapy exercises as directed. After your splint, bandage, brace, or cast is removed, your health care provider may recommend that you: Move your thumb in circles. Touch your thumb to your pinky finger. Do these exercises several times a day. Ask your health care provider if you may use a hand exerciser to strengthen your muscles. If your thumb feels stiff while you are exercising it, try doing the exercises while soaking your hand in warm water. Driving Ask your health care provider when it is safe to drive if you have a splint, bandage, brace, or cast on your hand or thumb. Ask your health care provider if the medicine prescribed to you requires you to avoid driving or using machinery. General instructions Take over-the-counter and prescription medicines only as told by your health care provider. Do not use any products that contain nicotine or tobacco. These products include cigarettes, chewing tobacco, and vaping devices, such as e-cigarettes. These can delay bone healing. If you need help quitting, ask your health care provider. Do not wear rings on your injured thumb. Keep all follow-up visits. This is important. Contact a health care provider if: You have pain that gets worse or does not get better with medicine. You have bruising or swelling that gets worse. Your cast, brace, or splint is damaged. Get  help right away if: Your thumb feels numb, tingles, turns cold, or turns blue, even after loosening your splint, bandage, or brace. Summary A thumb sprain is an injury to one of the bands of tissue that connect bones to each other (a ligament) in your thumb. Thumb sprains are more likely to occur in people who play sports that involve a risk of falling or having to catch an object. Treatment will depend on how severe the sprain is, but it will require keeping the thumb in a fixed position (immobilization). It might require surgery. Make sure you understand and follow all of your health care provider's instructions for home care. This information is not intended to replace advice given  to you by your health care provider. Make sure you discuss any questions you have with your health care provider. Document Revised: 04/18/2020 Document Reviewed: 04/18/2020 Elsevier Patient Education  2023 [employer].

## 2022-05-07 NOTE — Telephone Encounter (Signed)
Patient evaluated in clinic 05/06/22 see office note

## 2022-05-10 ENCOUNTER — Encounter: Payer: Self-pay | Admitting: Registered Nurse

## 2022-05-10 ENCOUNTER — Telehealth: Payer: Self-pay | Admitting: Registered Nurse

## 2022-05-10 DIAGNOSIS — M79645 Pain in left finger(s): Secondary | ICD-10-CM

## 2022-05-10 NOTE — Telephone Encounter (Signed)
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  Follow up with clinic staff if new or worsening symptoms.  Patient verbalized understanding information/instructions, agreed with plan of care and had no further questions at this time.

## 2022-05-29 DIAGNOSIS — R9389 Abnormal findings on diagnostic imaging of other specified body structures: Secondary | ICD-10-CM | POA: Insufficient documentation

## 2022-06-04 DIAGNOSIS — M65312 Trigger thumb, left thumb: Secondary | ICD-10-CM | POA: Insufficient documentation

## 2022-06-04 DIAGNOSIS — M79642 Pain in left hand: Secondary | ICD-10-CM | POA: Insufficient documentation

## 2022-09-16 ENCOUNTER — Encounter: Payer: Self-pay | Admitting: Registered Nurse

## 2022-09-16 ENCOUNTER — Ambulatory Visit: Payer: No Typology Code available for payment source | Admitting: Registered Nurse

## 2022-09-16 VITALS — BP 145/80 | HR 74

## 2022-09-16 DIAGNOSIS — M7989 Other specified soft tissue disorders: Secondary | ICD-10-CM

## 2022-09-16 NOTE — Progress Notes (Signed)
Swelling to Left ring finger. Ring below swelling. Unable to get off. Ice pack applied and elevated. 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 No pain at this time.

## 2022-09-16 NOTE — Progress Notes (Signed)
Subjective:    Patient ID: Tonya Farrell, female    DOB: 1960/08/18, 62 y.o.   MRN: [id number]  62y/o married caucasian female established patient works in [employer].  Hit finger on table at work yesterday denied bleeding/bruising/swelling/abrasion/laceration.  Woke up this morning and finger very swollen applied ice at home and came to clinic to see RN Kimrey more ice applied.  Not having pain, normal range of motion per patient was a little stiff initially this am on awakening but improved now, denied tingling/numbness or feeling cold prior to ice application.  Stated she normally does not remove wedding ring and is snug.      Review of Systems  Constitutional:  Negative for chills, diaphoresis, fatigue and fever.  HENT:  Negative for trouble swallowing and voice change.   Respiratory:  Negative for cough and shortness of breath.   Gastrointestinal:  Negative for nausea and vomiting.  Musculoskeletal:  Negative for gait problem, neck pain and neck stiffness.  Skin:  Negative for color change, pallor, rash and wound.  Neurological:  Negative for tremors, weakness and numbness.  Hematological:  Does not bruise/bleed easily.  Psychiatric/Behavioral:  Negative for agitation, confusion and sleep disturbance.        Objective:   Physical Exam Vitals and nursing note reviewed.  Constitutional:      General: She is awake. She is not in acute distress.    Appearance: Normal appearance. She is well-developed, well-groomed and overweight. She is not ill-appearing, toxic-appearing or diaphoretic.  HENT:     Head: Normocephalic and atraumatic.     Jaw: There is normal jaw occlusion.     Salivary Glands: Right salivary gland is not diffusely enlarged. Left salivary gland is not diffusely enlarged.     Right Ear: Hearing and external ear normal.     Left Ear: Hearing and external ear normal.     Nose: Nose normal. No congestion or rhinorrhea.     Mouth/Throat:     Lips:  Pink. No lesions.     Mouth: Mucous membranes are moist. No oral lesions.     Dentition: No gum lesions.     Pharynx: Oropharynx is clear. Uvula midline.  Eyes:     General: Lids are normal. Vision grossly intact. Gaze aligned appropriately. No scleral icterus.       Right eye: No discharge.        Left eye: No discharge.     Extraocular Movements: Extraocular movements intact.     Conjunctiva/sclera: Conjunctivae normal.     Pupils: Pupils are equal, round, and reactive to light.  Neck:     Trachea: Trachea normal.  Cardiovascular:     Rate and Rhythm: Normal rate and regular rhythm.     Pulses: Normal pulses.          Radial pulses are 2+ on the right side and 2+ on the left side.  Pulmonary:     Effort: Pulmonary effort is normal.     Breath sounds: Normal breath sounds and air entry. No stridor or transmitted upper airway sounds. No wheezing.     Comments: Spoke full sentences without difficulty; no cough observed in exam room Abdominal:     General: Abdomen is flat.  Musculoskeletal:        General: Swelling present. No tenderness or deformity. Normal range of motion.     Right elbow: No swelling, deformity, effusion or lacerations. Normal range of motion.     Left elbow: No swelling,  deformity, effusion or lacerations. Normal range of motion.     Right forearm: No swelling, edema, deformity, lacerations, tenderness or bony tenderness.     Left forearm: No swelling, edema, deformity, lacerations, tenderness or bony tenderness.     Right wrist: No swelling, deformity, effusion, lacerations, tenderness or bony tenderness. Normal range of motion.     Left wrist: No swelling, deformity, effusion, lacerations, tenderness or bony tenderness. Normal range of motion.     Right hand: No swelling, deformity, lacerations, tenderness or bony tenderness. Normal range of motion. Normal strength. Normal sensation. Normal capillary refill.     Left hand: Swelling present. No deformity,  lacerations, tenderness or bony tenderness. Normal range of motion. Normal strength. Normal sensation. Normal capillary refill.     Cervical back: Normal range of motion and neck supple.     Comments: 2+/4 nonpitting edema MCP to distal phalanx left 4th finger nontender no ecchymosis/erythema/abrasion/fluctuance; able to make fist/ab/adduction all fingers equally; capillary refill less than 2 seconds  Lymphadenopathy:     Head:     Right side of head: No submandibular or preauricular adenopathy.     Left side of head: No submandibular or preauricular adenopathy.     Cervical:     Right cervical: No superficial cervical adenopathy.    Left cervical: No superficial cervical adenopathy.  Skin:    General: Skin is warm and dry.     Capillary Refill: Capillary refill takes less than 2 seconds.     Coloration: Skin is not ashen, cyanotic, jaundiced, mottled, pale or sallow.     Findings: No abrasion, abscess, acne, bruising, burn, ecchymosis, erythema, signs of injury, laceration, lesion, petechiae, rash or wound. Rash is not macular or papular.     Nails: There is no clubbing.  Neurological:     General: No focal deficit present.     Mental Status: She is alert and oriented to person, place, and time. Mental status is at baseline.     GCS: GCS eye subscore is 4. GCS verbal subscore is 5. GCS motor subscore is 6.     Cranial Nerves: Cranial nerves 2-12 are intact. No cranial nerve deficit, dysarthria or facial asymmetry.     Sensory: Sensation is intact.     Motor: Motor function is intact. No weakness, tremor, atrophy, abnormal muscle tone or seizure activity.     Coordination: Coordination is intact. Coordination normal.     Gait: Gait is intact. Gait normal.     Comments: In/out of chair without difficulty; gait sure and steady in clinic; bilateral hand grasp equal 5/5  Psychiatric:        Attention and Perception: Attention and perception normal.        Mood and Affect: Mood and affect  normal.        Speech: Speech normal.        Behavior: Behavior normal. Behavior is cooperative.        Thought Content: Thought content normal.        Cognition and Memory: Cognition and memory normal.        Judgment: Judgment normal.           Assessment & Plan:   A-left 4th digit swelling  P-Discussed recommend ring removal since swelling has minimally decreased with 2 courses of ice since waking up this am and elevating above heart level.  Pulses intact and capillary refill less than 2 seconds with full arom.  Patient prefers to go to [facility] in [place]  for removal instead of medical facility.  ?strain versus contusion yesterday at work.  Continue cryotherapy 15 minutes topical QID prn and elevate above heart level if swelling.  RN Radio broadcast assistant of injury and report completed.  Patient to follow up with RN Bess Kinds tomorrow re-evaluation as RN only clinic tomorrow.  Patient agreed with plan of care and had no further questions at this time.

## 2022-09-16 NOTE — Patient Instructions (Signed)
Edema  Edema is an abnormal buildup of fluids in the body tissues and under the skin. Swelling of the legs, feet, and ankles is a common symptom that becomes more likely as you get older. Swelling is also common in looser tissues, such as around the eyes. Pressing on the area may make a temporary dent in your skin (pitting edema). This fluid may also accumulate in your lungs (pulmonary edema). There are many possible causes of edema. Eating too much salt (sodium) and being on your feet or sitting for a long time can cause edema in your legs, feet, and ankles. Common causes of edema include: Certain medical conditions, such as heart failure, liver or kidney disease, and cancer. Weak leg blood vessels. An injury. Pregnancy. Medicines. Being obese. Low protein levels in the blood. Hot weather may make edema worse. Edema is usually painless. Your skin may look swollen or shiny. Follow these instructions at home: Medicines Take over-the-counter and prescription medicines only as told by your health care provider. Your health care provider may prescribe a medicine to help your body get rid of extra water (diuretic). Take this medicine if you are told to take it. Eating and drinking Eat a low-salt (low-sodium) diet to reduce fluid as told by your health care provider. Sometimes, eating less salt may reduce swelling. Depending on the cause of your swelling, you may need to limit how much fluid you drink (fluid restriction). General instructions Raise (elevate) the injured area above the level of your heart while you are sitting or lying down. Do not sit still or stand for long periods of time. Do not wear tight clothing. Do not wear garters on your upper legs. Exercise your legs to get your circulation going. This helps to move the fluid back into your blood vessels, and it may help the swelling go down. Wear compression stockings as told by your health care provider. These stockings help to prevent  blood clots and reduce swelling in your legs. It is important that these are the correct size. These stockings should be prescribed by your health care provider to prevent possible injuries. If elastic bandages or wraps are recommended, use them as told by your health care provider. Contact a health care provider if: Your edema does not get better with treatment. You have heart, liver, or kidney disease and have symptoms of edema. You have sudden and unexplained weight gain. Get help right away if: You develop shortness of breath or chest pain. You cannot breathe when you lie down. You develop pain, redness, or warmth in the swollen areas. You have heart, liver, or kidney disease and suddenly get edema. You have a fever and your symptoms suddenly get worse. These symptoms may be an emergency. Get help right away. Call 911. Do not wait to see if the symptoms will go away. Do not drive yourself to the hospital. Summary Edema is an abnormal buildup of fluids in the body tissues and under the skin. Eating too much salt (sodium)and being on your feet or sitting for a long time can cause edema in your legs, feet, and ankles. Raise (elevate) the injured area above the level of your heart while you are sitting or lying down. Follow your health care provider's instructions about diet and how much fluid you can drink. This information is not intended to replace advice given to you by your health care provider. Make sure you discuss any questions you have with your health care provider. Document Revised: 01/28/2021 Document   Reviewed: 01/28/2021 Elsevier Patient Education  2023 [employer]  

## 2022-09-17 ENCOUNTER — Ambulatory Visit: Payer: No Typology Code available for payment source | Admitting: Occupational Medicine

## 2022-09-17 DIAGNOSIS — M7989 Other specified soft tissue disorders: Secondary | ICD-10-CM

## 2022-09-17 NOTE — Progress Notes (Signed)
Patient finger is much better had jeweler cut ring off. Swelling has gone down. Given ice pack to use on break. Educated to contact clinic if anything worsens or changes.

## 2022-09-25 ENCOUNTER — Other Ambulatory Visit: Payer: Self-pay | Admitting: Family Medicine

## 2022-09-25 DIAGNOSIS — M810 Age-related osteoporosis without current pathological fracture: Secondary | ICD-10-CM

## 2022-09-25 DIAGNOSIS — Z1231 Encounter for screening mammogram for malignant neoplasm of breast: Secondary | ICD-10-CM

## 2022-10-14 ENCOUNTER — Ambulatory Visit: Payer: No Typology Code available for payment source | Admitting: Registered Nurse

## 2022-10-14 ENCOUNTER — Encounter: Payer: Self-pay | Admitting: Registered Nurse

## 2022-10-14 DIAGNOSIS — M79645 Pain in left finger(s): Secondary | ICD-10-CM

## 2022-10-14 NOTE — Progress Notes (Signed)
Subjective:    Patient ID: Tonya Farrell, female    DOB: 09/23/1960, 62 y.o.   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  Her previous splint broke and would like replacement issued today.  Denied known injury/rash/bruising or swelling.  Pain with thumb flexion and abduction  Recurrent last episode Nov/Dec 2023 had joint injection from specialist to resolve previous episode.      Review of Systems  Constitutional:  Negative for chills and fever.  Musculoskeletal:  Positive for arthralgias and myalgias. Negative for gait problem, joint swelling, neck pain and neck stiffness.  Skin:  Negative for color change, rash and wound.  Neurological:  Negative for tremors, weakness and numbness.  Psychiatric/Behavioral:  Negative for agitation, confusion and sleep disturbance.        Objective:   Physical Exam Vitals and nursing note reviewed.  Constitutional:      General: She is awake. She is not in acute distress.    Appearance: Normal appearance. She is well-developed, well-groomed and overweight. She is not ill-appearing, toxic-appearing or diaphoretic.  HENT:     Head: Normocephalic and atraumatic.     Jaw: There is normal jaw occlusion.     Salivary Glands: Right salivary gland is not diffusely enlarged. Left salivary gland is not diffusely enlarged.     Right Ear: Hearing and external ear normal.     Left Ear: Hearing and external ear normal.     Nose: Nose normal. No congestion or rhinorrhea.     Mouth/Throat:     Lips: Pink. No lesions.     Mouth: Mucous membranes are moist.     Pharynx: Oropharynx is clear.  Eyes:     General: Lids are normal. Vision grossly intact. Gaze aligned appropriately. No scleral icterus.       Right eye: No discharge.        Left eye: No discharge.     Extraocular Movements: Extraocular movements intact.     Conjunctiva/sclera: Conjunctivae normal.      Pupils: Pupils are equal, round, and reactive to light.  Neck:     Trachea: Trachea normal.  Cardiovascular:     Rate and Rhythm: Normal rate and regular rhythm.     Pulses:          Radial pulses are 2+ on the right side and 2+ on the left side.  Pulmonary:     Effort: Pulmonary effort is normal.     Breath sounds: Normal breath sounds and air entry. No stridor or transmitted upper airway sounds. No wheezing.     Comments: Spoke full sentences without difficulty; no cough observed in exam room Abdominal:     General: Abdomen is flat.  Musculoskeletal:        General: Normal range of motion.     Right elbow: No swelling, deformity, effusion or lacerations. Normal range of motion.     Left elbow: No swelling, deformity, effusion or lacerations. Normal range of motion.     Right forearm: No swelling, edema, deformity or lacerations.     Left forearm: No swelling, edema, deformity or lacerations.     Right wrist: No swelling, deformity, effusion, lacerations or crepitus. Normal range of motion.     Left wrist: No swelling, deformity, effusion, lacerations or crepitus. Normal range of motion.     Right hand: No swelling, deformity or lacerations. Normal range of motion. Normal strength. Normal capillary refill.  Left hand: No swelling, deformity or lacerations. Normal range of motion. Normal strength. Normal capillary refill.     Cervical back: Normal range of motion and neck supple.  Lymphadenopathy:     Head:     Right side of head: No submandibular or preauricular adenopathy.     Left side of head: No submandibular or preauricular adenopathy.     Cervical:     Right cervical: No superficial cervical adenopathy.    Left cervical: No superficial cervical adenopathy.  Skin:    General: Skin is warm and dry.     Capillary Refill: Capillary refill takes less than 2 seconds.     Coloration: Skin is not ashen, cyanotic, jaundiced, mottled, pale or sallow.     Findings: No abrasion,  abscess, acne, bruising, burn, ecchymosis, erythema, signs of injury, laceration, lesion, petechiae, rash or wound.     Nails: There is no clubbing.  Neurological:     General: No focal deficit present.     Mental Status: She is alert and oriented to person, place, and time. Mental status is at baseline.     GCS: GCS eye subscore is 4. GCS verbal subscore is 5. GCS motor subscore is 6.     Cranial Nerves: No cranial nerve deficit, dysarthria or facial asymmetry.     Motor: Motor function is intact. No weakness, tremor, atrophy, abnormal muscle tone or seizure activity.     Coordination: Coordination is intact. Coordination normal.     Gait: Gait is intact. Gait normal.     Comments:  gait sure and steady in clinic; bilateral hand grasp equal 5/5  Psychiatric:        Attention and Perception: Attention and perception normal.        Mood and Affect: Mood and affect normal.        Speech: Speech normal.        Behavior: Behavior normal. Behavior is cooperative.        Thought Content: Thought content normal.        Cognition and Memory: Cognition and memory normal.        Judgment: Judgment normal.      Spoke with Conrad Lampasas.  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  Denied known injury.  Will treat with thumb spica splint inhouse at this time.     Assessment & Plan:   A-pain of left thumb acute recurrent initial visit   P-discussed most likely repetitive motion strain (silver packing and pulling) but cannot rule out arthritis without xrays.  Trial splint thumb left x 2 weeks. Fitted and distributed medium left thumb spica wrist splint from clinic stock  Consider ice 10-15 minutes after work.  Notify NP if she wants/needs refill mobic 15mg  po daily take with food. Avoid other nsaids/topical diclofenac while taking mobic. If unable to perform work duties will need to be scheduled with [facility] see RN Kimrey to  schedule.  Patient does not want work restrictions at this time stated able to perform work duties without difficulty.  Follow up re-evaluation 2 weeks sooner if new or worsening symptoms.  Exitcare handout on tendonitis and thumb sprain .  Patient verbalized understanding information/instructions, agreed with plan of care and had no further questions at this time.

## 2022-10-14 NOTE — Patient Instructions (Signed)
Tendinitis  Tendinitis is inflammation of a tendon. A tendon is a strong cord of tissue that connects muscle to bone. Tendinitis can affect any tendon, but it most commonly affects the: Shoulder tendon (biceps tendon or rotator cuff). Ankle tendon (Achilles tendon). Elbow tendons. Tendons in the wrist. What are the causes? This condition may be caused by: Overusing a tendon or muscle. This is the most common cause. Age-related wear and tear. Injury. Inflammatory conditions, such as arthritis. Certain medicines. What increases the risk? You are more likely to develop this condition if you do activities that involve the same movements over and over again (repetitive motions). What are the signs or symptoms? Symptoms of this condition may include: Pain. Tenderness. Mild swelling. Decreased range of motion. How is this diagnosed? This condition is diagnosed with a physical exam. You may also have tests, such as: Ultrasound. This uses sound waves to make an image of the inside of your body in the affected area. MRI. This uses magnetic fields and radio waves to make an image of the inside of your body in the affected area. How is this treated? This condition may be treated by resting, icing, applying pressure (compression), and raising (elevating) the affected area above the level of your heart. This is known as RICE therapy. Treatment may also include: Medicines to help reduce inflammation or to help reduce pain. Exercises or physical therapy to improve movement and strength of the tendon. A brace or splint. Injection of corticosteroid medicine. This may be done in some cases. Surgery. This is rarely needed and only used if all other treatment has failed. Follow these instructions at home: If you have a removable splint or brace: Wear the splint or brace as told by your health care provider. Remove it only as told by your health care provider. Check the skin around the splint or brace  every day. Tell your health care provider about any concerns. Loosen the splint or brace if your fingers or toes tingle, become numb, or turn cold and blue. Keep the splint or brace clean. If the splint or brace is not waterproof: Do not let it get wet. Cover it with a watertight covering when you take a bath or shower, or remove it as told by your health care provider. Managing pain, stiffness, and swelling     If directed, put ice on the affected area. To do this: If you have a removable splint or brace, remove it as told by your health care provider. Put ice in a plastic bag. Place a towel between your skin and the bag. Leave the ice on for 20 minutes, 2-3 times a day. Remove the ice if your skin turns bright red. This is very important. If you cannot feel pain, heat, or cold, you have a greater risk of damage to the area. Move the fingers or toes of the affected limb often, if this applies. This can help to reduce stiffness and swelling. If directed, elevate the affected area above the level of your heart while you are sitting or lying down. If directed, apply heat to the affected area before you exercise. Use the heat source that your health care provider recommends, such as a moist heat pack or a heating pad. Place a towel between your skin and the heat source. Leave the heat on for 20-30 minutes. Remove the heat if your skin turns bright red. This is especially important if you are unable to feel pain, heat, or cold. You have a   greater risk of getting burned. Activity Rest the affected area as told by your health care provider. Ask your health care provider when it is safe to drive if you have a splint or brace on any part of your arm or leg. Return to your normal activities as told by your health care provider. Ask your health care provider what activities are safe for you. Avoid using the affected area while you are experiencing symptoms of tendinitis. Do exercises as told by your  health care provider. General instructions Wear an elastic bandage or compression wrap only as told by your health care provider. Take over-the-counter and prescription medicines only as told by your health care provider. Keep all follow-up visits. This is important. Contact a health care provider if: Your symptoms do not improve. You develop new, unexplained problems, such as numbness in your hands or feet. Summary Tendinitis is inflammation of a tendon. You are more likely to develop this condition if you do activities that involve the same movements over and over again. This condition may be treated by resting, icing, applying pressure (compression), and elevating the area above the level of your heart. This is known as RICE therapy. Avoid using the affected area while you are experiencing symptoms of tendinitis. This information is not intended to replace advice given to you by your health care provider. Make sure you discuss any questions you have with your health care provider. Document Revised: 01/31/2021 Document Reviewed: 01/31/2021 Elsevier Patient Education  2023 [employer] Thumb Sprain  A thumb sprain is an injury to one of the bands of tissue that connect bones to each other (a ligament) in your thumb. The ligament may be stretched too much, or it may be torn. A tear can be either partial or complete. How bad, or severe, the sprain is depends on how much of the ligament was damaged or torn. What are the causes? A thumb sprain is often caused by a fall or an accident, such as when you hold your hands out to catch something or to protect yourself. What increases the risk? This injury is more likely to occur in people who play sports that involve: A risk of falling, such as skiing. Catching an object, such as basketball. What are the signs or symptoms? Symptoms of this condition include: Not being able to move the thumb normally. Swelling. Tenderness. Bruising. How is  this diagnosed? This condition may be diagnosed based on: Your symptoms and medical history. Your health care provider may ask about any recent injuries to your thumb. A physical exam. Imaging studies, such as X-rays, ultrasound, or MRI. How is this treated? Treatment for this condition depends on how severe your sprain is. If your ligament is overstretched or partially torn, treatment usually involves keeping your thumb in a fixed position (immobilization) for at least 4 to 6 weeks. Your health care provider will apply a bandage (dressing), splint, brace, or cast to keep your thumb from moving until it heals. If your ligament is fully torn, you may need surgery to reconnect the ligament to the bone. After surgery, you will need to wear a cast or splint on your thumb. Your health care provider may also recommend physical therapy to strengthen your thumb. Follow these instructions at home: If you have a removable splint, bandage, or brace: Wear the splint, bandage, or brace as told by your health care provider. Remove it only as told by your health care provider. Check the skin around the splint, bandage, or  brace every day. Tell your health care provider about any concerns. Loosen the splint, bandage, or brace if your thumb or fingers tingle, become numb, or turn cold and blue. Keep it clean and dry. If you have a nonremovable cast: Do not put pressure on any part of the cast until it is fully hardened. This may take several hours. Do not stick anything inside the cast to scratch your skin. Doing that increases your risk of infection. Check the skin around the cast every day. Tell your health care provider about any concerns. You may put lotion on dry skin around the edges of the cast. Do not put lotion on the skin underneath the cast. Keep it clean and dry. Bathing Do not take baths, swim, or use a hot tub until your health care provider approves. Ask your health care provider if you may take  showers. You may only be allowed to take sponge baths. If your splint, bandage, brace, or cast is not waterproof: Do not let it get wet. Cover it with a watertight covering when you take a bath or shower. Managing pain, stiffness, and swelling  If directed, put ice on your thumb. To do this: If you have a removable splint, bandage, or brace, remove it as told by your health care provider. Put ice in a plastic bag. Place a towel between your skin and the bag, or between your cast and the bag. Leave the ice on for 20 minutes, 2-3 times a day. Remove the ice if your skin turns bright red. This is very important. If you cannot feel pain, heat, or cold, you have a greater risk of damage to the area. Move your fingers often to reduce stiffness and swelling. Raise (elevate) the injured area above the level of your heart while you are sitting or lying down. Activity Return to your normal activities as told by your health care provider. Ask your health care provider what activities are safe for you. Do physical therapy exercises as directed. After your splint, bandage, brace, or cast is removed, your health care provider may recommend that you: Move your thumb in circles. Touch your thumb to your pinky finger. Do these exercises several times a day. Ask your health care provider if you may use a hand exerciser to strengthen your muscles. If your thumb feels stiff while you are exercising it, try doing the exercises while soaking your hand in warm water. Driving Ask your health care provider when it is safe to drive if you have a splint, bandage, brace, or cast on your hand or thumb. Ask your health care provider if the medicine prescribed to you requires you to avoid driving or using machinery. General instructions Take over-the-counter and prescription medicines only as told by your health care provider. Do not use any products that contain nicotine or tobacco. These products include cigarettes,  chewing tobacco, and vaping devices, such as e-cigarettes. These can delay bone healing. If you need help quitting, ask your health care provider. Do not wear rings on your injured thumb. Keep all follow-up visits. This is important. Contact a health care provider if: You have pain that gets worse or does not get better with medicine. You have bruising or swelling that gets worse. Your cast, brace, or splint is damaged. Get help right away if: Your thumb feels numb, tingles, turns cold, or turns blue, even after loosening your splint, bandage, or brace. Summary A thumb sprain is an injury to one of the bands of  tissue that connect bones to each other (a ligament) in your thumb. Thumb sprains are more likely to occur in people who play sports that involve a risk of falling or having to catch an object. Treatment will depend on how severe the sprain is, but it will require keeping the thumb in a fixed position (immobilization). It might require surgery. Make sure you understand and follow all of your health care provider's instructions for home care. This information is not intended to replace advice given to you by your health care provider. Make sure you discuss any questions you have with your health care provider. Document Revised: 04/18/2020 Document Reviewed: 04/18/2020 Elsevier Patient Education  2023 [employer].

## 2023-01-05 ENCOUNTER — Ambulatory Visit: Payer: No Typology Code available for payment source | Admitting: Occupational Medicine

## 2023-01-05 VITALS — BP 128/84

## 2023-01-05 DIAGNOSIS — Z0000 Encounter for general adult medical examination without abnormal findings: Secondary | ICD-10-CM

## 2023-01-05 NOTE — Progress Notes (Signed)
[facility] insurance premium discount evaluation: Met  Patient was smoker stopped 12/15/22 completed tobacco cessation alteratives.   Patient completed PCM office visit. Epic reviewed by RN Kimrey transcribed labs and reviewed with the patient.    Tobacco attestation signed. [facility] ROI formed signed. Forms placed in the chart.   Patient given handouts for [facility] pharmacies and discount drugs list, MyChart, [facility], [facility], [place] counseling and [employer] [facility] counseling.  What to do for infectious illness protocol. Given handout for list of medications that can be filled at [facility]. Given Clinic hours and Clinic Email.

## 2023-01-07 ENCOUNTER — Telehealth: Payer: Self-pay | Admitting: Registered Nurse

## 2023-01-07 ENCOUNTER — Encounter: Payer: Self-pay | Admitting: Registered Nurse

## 2023-01-07 DIAGNOSIS — Z0000 Encounter for general adult medical examination without abnormal findings: Secondary | ICD-10-CM

## 2023-01-07 NOTE — Telephone Encounter (Signed)
PCM appt for labs BP 137/67 patient on amlodipinemetformin ER 500mg , cozaar and pravachol 20mg  PCM wants follow up in 3 months; LDL 89 Hgba1c 6.4 weight 181lbs stopped smoking 15 Dec 2022 completed [employer] tobacco cessation video and quiz reviewed and signed 01/06/23 alternative [facility] provider paperwork.  HR Kimrey notified HR team met requirements for insurance discount FY 2025  last PCM appt 12/29/22 per epic  Discussed nicotine transdermal with PCM but decided not to start as had not smoked in previous 2 weeks without it

## 2023-01-16 NOTE — Progress Notes (Signed)
Sent message, via epic in basket, requesting orders in epic from surgeon.  

## 2023-01-21 NOTE — Patient Instructions (Addendum)
DUE TO COVID-19 ONLY TWO VISITORS  (aged 62 and older)  ARE ALLOWED TO COME WITH YOU AND STAY IN THE WAITING ROOM ONLY DURING PRE OP AND PROCEDURE.   **NO VISITORS ARE ALLOWED IN THE SHORT STAY AREA OR RECOVERY ROOM!!**  IF YOU WILL BE ADMITTED INTO THE HOSPITAL YOU ARE ALLOWED ONLY FOUR SUPPORT PEOPLE DURING VISITATION HOURS ONLY (7 AM -8PM)   The support person(s) must pass our screening, gel in and out, and wear a mask at all times, including in the patient's room. Patients must also wear a mask when staff or their support person are in the room. Visitors GUEST BADGE MUST BE WORN VISIBLY  One adult visitor may remain with you overnight and MUST be in the room by 8 P.M.     Your procedure is scheduled on: 02/04/23   Report to [facility] Main Entrance    Report to admitting at: 5:15 AM   Call this number if you have problems the morning of surgery [phone]   Do not eat food :After Midnight.   After Midnight you may have the following liquids until : 4:30 AM DAY OF SURGERY  Water Black Coffee (sugar ok, NO MILK/CREAM OR CREAMERS)  Tea (sugar ok, NO MILK/CREAM OR CREAMERS) regular and decaf                             Plain Jell-O (NO RED)                                           Fruit ices (not with fruit pulp, NO RED)                                     Popsicles (NO RED)                                                                  Juice: apple, WHITE grape, WHITE cranberry Sports drinks like Gatorade (NO RED)    The day of surgery:  Drink ONE (1) Pre-Surgery Clear G2 at : 4:30 AM the morning of surgery. Drink in one sitting. Do not sip.  This drink was given to you during your hospital  pre-op appointment visit. Nothing else to drink after completing the  Pre-Surgery Clear Ensure or G2.          If you have questions, please contact your surgeon's office.  FOLLOW ANY ADDITIONAL PRE OP INSTRUCTIONS YOU RECEIVED FROM YOUR SURGEON'S OFFICE!!!   Oral Hygiene  is also important to reduce your risk of infection.                                    Remember - BRUSH YOUR TEETH THE MORNING OF SURGERY WITH YOUR REGULAR TOOTHPASTE  DENTURES WILL BE REMOVED PRIOR TO SURGERY PLEASE DO NOT APPLY "Poly grip" OR ADHESIVES!!!   Do NOT smoke after Midnight   Take these medicines the morning of surgery  with A SIP OF WATER: N/A  DO NOT TAKE ANY ORAL DIABETIC MEDICATIONS DAY OF YOUR SURGERY  Bring CPAP mask and tubing day of surgery.                              You may not have any metal on your body including hair pins, jewelry, and body piercing             Do not wear make-up, lotions, powders, perfumes/cologne, or deodorant  Do not wear nail polish including gel and S&S, artificial/acrylic nails, or any other type of covering on natural nails including finger and toenails. If you have artificial nails, gel coating, etc. that needs to be removed by a nail salon please have this removed prior to surgery or surgery may need to be canceled/ delayed if the surgeon/ anesthesia feels like they are unable to be safely monitored.   Do not shave  48 hours prior to surgery.    Do not bring valuables to the hospital. [facility] IS NOT             RESPONSIBLE   FOR VALUABLES.   Contacts, glasses, or bridgework may not be worn into surgery.   Bring small overnight bag day of surgery.   DO NOT BRING YOUR HOME MEDICATIONS TO THE HOSPITAL. PHARMACY WILL DISPENSE MEDICATIONS LISTED ON YOUR MEDICATION LIST TO YOU DURING YOUR ADMISSION IN THE HOSPITAL!    Patients discharged on the day of surgery will not be allowed to drive home.  Someone NEEDS to stay with you for the first 24 hours after anesthesia.   Special Instructions: Bring a copy of your healthcare power of attorney and living will documents         the day of surgery if you haven't scanned them before.              Please read over the following fact sheets you were given: IF YOU HAVE QUESTIONS ABOUT YOUR  PRE-OP INSTRUCTIONS PLEASE CALL [phone]      Pre-operative 5 CHG Bath Instructions   You can play a key role in reducing the risk of infection after surgery. Your skin needs to be as free of germs as possible. You can reduce the number of germs on your skin by washing with CHG (chlorhexidine gluconate) soap before surgery. CHG is an antiseptic soap that kills germs and continues to kill germs even after washing.   DO NOT use if you have an allergy to chlorhexidine/CHG or antibacterial soaps. If your skin becomes reddened or irritated, stop using the CHG and notify one of our RNs at [phone].   Please shower with the CHG soap starting 4 days before surgery using the following schedule:     Please keep in mind the following:  DO NOT shave, including legs and underarms, starting the day of your first shower.   You may shave your face at any point before/day of surgery.  Place clean sheets on your bed the day you start using CHG soap. Use a clean washcloth (not used since being washed) for each shower. DO NOT sleep with pets once you start using the CHG.   CHG Shower Instructions:  If you choose to wash your hair and private area, wash first with your normal shampoo/soap.  After you use shampoo/soap, rinse your hair and body thoroughly to remove shampoo/soap residue.  Turn the water OFF and apply about  3 tablespoons (45 ml) of CHG soap to a CLEAN washcloth.  Apply CHG soap ONLY FROM YOUR NECK DOWN TO YOUR TOES (washing for 3-5 minutes)  DO NOT use CHG soap on face, private areas, open wounds, or sores.  Pay special attention to the area where your surgery is being performed.  If you are having back surgery, having someone wash your back for you may be helpful. Wait 2 minutes after CHG soap is applied, then you may rinse off the CHG soap.  Pat dry with a clean towel  Put on clean clothes/pajamas   If you choose to wear lotion, please use ONLY the CHG-compatible lotions on the  back of this paper.     Additional instructions for the day of surgery: DO NOT APPLY any lotions, deodorants, cologne, or perfumes.   Put on clean/comfortable clothes.  Brush your teeth.  Ask your nurse before applying any prescription medications to the skin.    CHG Compatible Lotions   Aveeno Moisturizing lotion  Cetaphil Moisturizing Cream  Cetaphil Moisturizing Lotion  Clairol Herbal Essence Moisturizing Lotion, Dry Skin  Clairol Herbal Essence Moisturizing Lotion, Extra Dry Skin  Clairol Herbal Essence Moisturizing Lotion, Normal Skin  Curel Age Defying Therapeutic Moisturizing Lotion with Alpha Hydroxy  Curel Extreme Care Body Lotion  Curel Soothing Hands Moisturizing Hand Lotion  Curel Therapeutic Moisturizing Cream, Fragrance-Free  Curel Therapeutic Moisturizing Lotion, Fragrance-Free  Curel Therapeutic Moisturizing Lotion, Original Formula  Eucerin Daily Replenishing Lotion  Eucerin Dry Skin Therapy Plus Alpha Hydroxy Crme  Eucerin Dry Skin Therapy Plus Alpha Hydroxy Lotion  Eucerin Original Crme  Eucerin Original Lotion  Eucerin Plus Crme Eucerin Plus Lotion  Eucerin TriLipid Replenishing Lotion  Keri Anti-Bacterial Hand Lotion  Keri Deep Conditioning Original Lotion Dry Skin Formula Softly Scented  Keri Deep Conditioning Original Lotion, Fragrance Free Sensitive Skin Formula  Keri Lotion Fast Absorbing Fragrance Free Sensitive Skin Formula  Keri Lotion Fast Absorbing Softly Scented Dry Skin Formula  Keri Original Lotion  Keri Skin Renewal Lotion Keri Silky Smooth Lotion  Keri Silky Smooth Sensitive Skin Lotion  Nivea Body Creamy Conditioning Oil  Nivea Body Extra Enriched Lotion  Nivea Body Original Lotion  Nivea Body Sheer Moisturizing Lotion Nivea Crme  Nivea Skin Firming Lotion  NutraDerm 30 Skin Lotion  NutraDerm Skin Lotion  NutraDerm Therapeutic Skin Cream  NutraDerm Therapeutic Skin Lotion  ProShield Protective Hand Cream  Provon moisturizing  lotion   Incentive Spirometer  An incentive spirometer is a tool that can help keep your lungs clear and active. This tool measures how well you are filling your lungs with each breath. Taking long deep breaths may help reverse or decrease the chance of developing breathing (pulmonary) problems (especially infection) following: A long period of time when you are unable to move or be active. BEFORE THE PROCEDURE  If the spirometer includes an indicator to show your best effort, your nurse or respiratory therapist will set it to a desired goal. If possible, sit up straight or lean slightly forward. Try not to slouch. Hold the incentive spirometer in an upright position. INSTRUCTIONS FOR USE  Sit on the edge of your bed if possible, or sit up as far as you can in bed or on a chair. Hold the incentive spirometer in an upright position. Breathe out normally. Place the mouthpiece in your mouth and seal your lips tightly around it. Breathe in slowly and as deeply as possible, raising the piston or the ball toward the top of  the column. Hold your breath for 3-5 seconds or for as long as possible. Allow the piston or ball to fall to the bottom of the column. Remove the mouthpiece from your mouth and breathe out normally. Rest for a few seconds and repeat Steps 1 through 7 at least 10 times every 1-2 hours when you are awake. Take your time and take a few normal breaths between deep breaths. The spirometer may include an indicator to show your best effort. Use the indicator as a goal to work toward during each repetition. After each set of 10 deep breaths, practice coughing to be sure your lungs are clear. If you have an incision (the cut made at the time of surgery), support your incision when coughing by placing a pillow or rolled up towels firmly against it. Once you are able to get out of bed, walk around indoors and cough well. You may stop using the incentive spirometer when instructed by your  caregiver.  RISKS AND COMPLICATIONS Take your time so you do not get dizzy or light-headed. If you are in pain, you may need to take or ask for pain medication before doing incentive spirometry. It is harder to take a deep breath if you are having pain. AFTER USE Rest and breathe slowly and easily. It can be helpful to keep track of a log of your progress. Your caregiver can provide you with a simple table to help with this. If you are using the spirometer at home, follow these instructions: SEEK MEDICAL CARE IF:  You are having difficultly using the spirometer. You have trouble using the spirometer as often as instructed. Your pain medication is not giving enough relief while using the spirometer. You develop fever of 100.5 F (38.1 C) or higher. SEEK IMMEDIATE MEDICAL CARE IF:  You cough up bloody sputum that had not been present before. You develop fever of 102 F (38.9 C) or greater. You develop worsening pain at or near the incision site. MAKE SURE YOU:  Understand these instructions. Will watch your condition. Will get help right away if you are not doing well or get worse. Document Released: 10/06/2006 Document Revised: 08/18/2011 Document Reviewed: 12/07/2006 [employer] Patient Information 2014 [employer], [place].   ________________________________________________________________________   Incentive Spirometer  An incentive spirometer is a tool that can help keep your lungs clear and active. This tool measures how well you are filling your lungs with each breath. Taking long deep breaths may help reverse or decrease the chance of developing breathing (pulmonary) problems (especially infection) following: A long period of time when you are unable to move or be active. BEFORE THE PROCEDURE  If the spirometer includes an indicator to show your best effort, your nurse or respiratory therapist will set it to a desired goal. If possible, sit up straight or lean slightly forward. Try  not to slouch. Hold the incentive spirometer in an upright position. INSTRUCTIONS FOR USE  Sit on the edge of your bed if possible, or sit up as far as you can in bed or on a chair. Hold the incentive spirometer in an upright position. Breathe out normally. Place the mouthpiece in your mouth and seal your lips tightly around it. Breathe in slowly and as deeply as possible, raising the piston or the ball toward the top of the column. Hold your breath for 3-5 seconds or for as long as possible. Allow the piston or ball to fall to the bottom of the column. Remove the mouthpiece from your mouth  and breathe out normally. Rest for a few seconds and repeat Steps 1 through 7 at least 10 times every 1-2 hours when you are awake. Take your time and take a few normal breaths between deep breaths. The spirometer may include an indicator to show your best effort. Use the indicator as a goal to work toward during each repetition. After each set of 10 deep breaths, practice coughing to be sure your lungs are clear. If you have an incision (the cut made at the time of surgery), support your incision when coughing by placing a pillow or rolled up towels firmly against it. Once you are able to get out of bed, walk around indoors and cough well. You may stop using the incentive spirometer when instructed by your caregiver.  RISKS AND COMPLICATIONS Take your time so you do not get dizzy or light-headed. If you are in pain, you may need to take or ask for pain medication before doing incentive spirometry. It is harder to take a deep breath if you are having pain. AFTER USE Rest and breathe slowly and easily. It can be helpful to keep track of a log of your progress. Your caregiver can provide you with a simple table to help with this. If you are using the spirometer at home, follow these instructions: SEEK MEDICAL CARE IF:  You are having difficultly using the spirometer. You have trouble using the spirometer as  often as instructed. Your pain medication is not giving enough relief while using the spirometer. You develop fever of 100.5 F (38.1 C) or higher. SEEK IMMEDIATE MEDICAL CARE IF:  You cough up bloody sputum that had not been present before. You develop fever of 102 F (38.9 C) or greater. You develop worsening pain at or near the incision site. MAKE SURE YOU:  Understand these instructions. Will watch your condition. Will get help right away if you are not doing well or get worse. Document Released: 10/06/2006 Document Revised: 08/18/2011 Document Reviewed: 12/07/2006 [facility] Patient Information 2014 [place], [place].   ________________________________________________________________________

## 2023-01-22 ENCOUNTER — Ambulatory Visit: Payer: Self-pay | Admitting: Emergency Medicine

## 2023-01-22 DIAGNOSIS — G8929 Other chronic pain: Secondary | ICD-10-CM

## 2023-01-22 NOTE — H&P (Signed)
[name]  Patient is being admitted for left total knee arthroplasty.  Subjective:  Chief Complaint:left knee pain.  HPI: Tonya Farrell, 62 y.o. female, has a history of pain and functional disability in the left knee due to arthritis and has failed non-surgical conservative treatments for greater than 12 weeks to includeNSAID's and/or analgesics, corticosteriod injections, and activity modification.  Onset of symptoms was gradual, starting 1 years ago with gradually worsening course since that time. The patient noted prior procedures on the knee to include  arthroscopy on the left knee(s).  Patient currently rates pain in the left knee(s) at 10 out of 10 with activity. Patient has night pain, worsening of pain with activity and weight bearing, pain that interferes with activities of daily living, and pain with passive range of motion.  Patient has evidence of periarticular osteophytes and joint space narrowing by imaging studies.  There is no active infection.  Patient Active Problem List   Diagnosis Date Noted   Elevated LFTs 08/19/2021   Vitamin D deficiency 08/19/2021   Hypertension 05/18/2020   Onycholysis 05/18/2020   Hyperlipidemia 02/15/2020   Osteoporosis 02/15/2020   Pes anserinus bursitis of left knee 07/16/2017   S/P left knee arthroscopy 03/27/2017   Old peripheral tear of medial meniscus of left knee 02/26/2017   Left knee pain 09/02/2016   Obesity (BMI 30-39.9) 09/02/2016   Tobacco abuse 09/02/2016   Type 2 diabetes mellitus without complication, without long-term current use of insulin (HCC) 09/02/2016   Prediabetes 09/02/2016   Past Medical History:  Diagnosis Date   Diabetes mellitus without complication (HCC)     Past Surgical History:  Procedure Laterality Date   FOOT SURGERY Right     Current Outpatient Medications  Medication Sig Dispense Refill Last Dose   alendronate (FOSAMAX) 70 MG tablet Take 70 mg by mouth once a week.      aspirin  EC 325 MG tablet Take 325 mg by mouth daily.      Cholecalciferol 125 MCG (5000 UT) TABS Take 5,000 Units by mouth daily.      losartan (COZAAR) 25 MG tablet Take 25 mg by mouth daily.      meloxicam (MOBIC) 15 MG tablet TAKE 1 TABLET(15 MG) BY MOUTH DAILY (Patient not taking: Reported on 01/16/2023) 30 tablet 2    metFORMIN (GLUCOPHAGE-XR) 500 MG 24 hr tablet Take 500 mg by mouth daily with breakfast.      pravastatin (PRAVACHOL) 20 MG tablet Take 20 mg by mouth daily.      No current facility-administered medications for this visit.   No Known Allergies  Social History   Tobacco Use   Smoking status: Every Day    Current packs/day: 1.00    Types: Cigarettes   Smokeless tobacco: Never  Substance Use Topics   Alcohol use: No    No family history on file.   Review of Systems  Musculoskeletal:  Positive for arthralgias.  All other systems reviewed and are negative.   Objective:  Physical Exam Constitutional:      General: She is not in acute distress.    Appearance: Normal appearance. She is not ill-appearing.  HENT:     Head: Normocephalic and atraumatic.     Right Ear: External ear normal.     Left Ear: External ear normal.     Nose: Nose normal.     Mouth/Throat:     Mouth: Mucous membranes are moist.     Pharynx: Oropharynx is clear.  Eyes:     Extraocular Movements: Extraocular movements intact.     Conjunctiva/sclera: Conjunctivae normal.  Cardiovascular:     Rate and Rhythm: Normal rate and regular rhythm.     Pulses: Normal pulses.     Heart sounds: Normal heart sounds.  Pulmonary:     Effort: Pulmonary effort is normal.     Breath sounds: Normal breath sounds.  Abdominal:     General: Bowel sounds are normal.     Palpations: Abdomen is soft.     Tenderness: There is no abdominal tenderness.  Musculoskeletal:        General: Tenderness present.     Cervical back: Normal range of motion and neck supple.     Comments: TTP over medial and lateral joint line,  medial worse than lateral.  No calf tenderness, swelling, or erythema.  No overlying lesions of area of chief complaint.  Decreased strength and ROM due to elicited pain.  Pre-operative ROM 0-115.  Dorsiflexion and plantarflexion intact.  Stable to varus and valgus stress, mild-moderate varus deformity.  BLE appear grossly neurovascularly intact.  Gait mildly antalgic.   Skin:    General: Skin is warm and dry.  Neurological:     Mental Status: She is alert and oriented to person, place, and time. Mental status is at baseline.  Psychiatric:        Mood and Affect: Mood normal.        Behavior: Behavior normal.     Vital signs in last 24 hours: @VSRANGES @  Labs:   Estimated body mass index is 33.29 kg/m as calculated from the following:   Height as of 07/12/12: 5\' 2"  (1.575 m).   Weight as of 07/12/12: 82.6 kg.   Imaging Review Plain radiographs demonstrate severe degenerative joint disease of the left knee(s). The overall alignment is mild-moderate varus deformity . The bone quality appears to be good for age and reported activity level.     Assessment/Plan:  End stage arthritis, left knee   The patient history, physical examination, clinical judgment of the provider and imaging studies are consistent with end stage degenerative joint disease of the left knee(s) and total knee arthroplasty is deemed medically necessary. The treatment options including medical management, injection therapy arthroscopy and arthroplasty were discussed at length. The risks and benefits of total knee arthroplasty were presented and reviewed. The risks due to aseptic loosening, infection, stiffness, patella tracking problems, thromboembolic complications and other imponderables were discussed. The patient acknowledged the explanation, agreed to proceed with the plan and consent was signed. Patient is being admitted for inpatient treatment for surgery, pain control, PT, OT, prophylactic antibiotics, VTE  prophylaxis, progressive ambulation and ADL's and discharge planning. The patient is planning to be discharged  outpatient PT    Patient's anticipated LOS is less than 2 midnights, meeting these requirements: - Younger than 95 - Lives within 1 hour of care - Has a competent adult at home to recover with post-op recover - NO history of  - Chronic pain requiring opiods  - Diabetes  - Coronary Artery Disease  - Heart failure  - Heart attack  - Stroke  - DVT/VTE  - Cardiac arrhythmia  - Respiratory Failure/COPD  - Renal failure  - Anemia  - Advanced Liver disease

## 2023-01-22 NOTE — H&P (View-Only) (Signed)
 [name]  Patient is being admitted for left total knee arthroplasty.  Subjective:  Chief Complaint:left knee pain.  HPI: Tonya Farrell, 62 y.o. female, has a history of pain and functional disability in the left knee due to arthritis and has failed non-surgical conservative treatments for greater than 12 weeks to includeNSAID's and/or analgesics, corticosteriod injections, and activity modification.  Onset of symptoms was gradual, starting 1 years ago with gradually worsening course since that time. The patient noted prior procedures on the knee to include  arthroscopy on the left knee(s).  Patient currently rates pain in the left knee(s) at 10 out of 10 with activity. Patient has night pain, worsening of pain with activity and weight bearing, pain that interferes with activities of daily living, and pain with passive range of motion.  Patient has evidence of periarticular osteophytes and joint space narrowing by imaging studies.  There is no active infection.  Patient Active Problem List   Diagnosis Date Noted   Elevated LFTs 08/19/2021   Vitamin D deficiency 08/19/2021   Hypertension 05/18/2020   Onycholysis 05/18/2020   Hyperlipidemia 02/15/2020   Osteoporosis 02/15/2020   Pes anserinus bursitis of left knee 07/16/2017   S/P left knee arthroscopy 03/27/2017   Old peripheral tear of medial meniscus of left knee 02/26/2017   Left knee pain 09/02/2016   Obesity (BMI 30-39.9) 09/02/2016   Tobacco abuse 09/02/2016   Type 2 diabetes mellitus without complication, without long-term current use of insulin (HCC) 09/02/2016   Prediabetes 09/02/2016   Past Medical History:  Diagnosis Date   Diabetes mellitus without complication (HCC)     Past Surgical History:  Procedure Laterality Date   FOOT SURGERY Right     Current Outpatient Medications  Medication Sig Dispense Refill Last Dose   alendronate (FOSAMAX) 70 MG tablet Take 70 mg by mouth once a week.      aspirin  EC 325 MG tablet Take 325 mg by mouth daily.      Cholecalciferol 125 MCG (5000 UT) TABS Take 5,000 Units by mouth daily.      losartan (COZAAR) 25 MG tablet Take 25 mg by mouth daily.      meloxicam (MOBIC) 15 MG tablet TAKE 1 TABLET(15 MG) BY MOUTH DAILY (Patient not taking: Reported on 01/16/2023) 30 tablet 2    metFORMIN (GLUCOPHAGE-XR) 500 MG 24 hr tablet Take 500 mg by mouth daily with breakfast.      pravastatin (PRAVACHOL) 20 MG tablet Take 20 mg by mouth daily.      No current facility-administered medications for this visit.   No Known Allergies  Social History   Tobacco Use   Smoking status: Every Day    Current packs/day: 1.00    Types: Cigarettes   Smokeless tobacco: Never  Substance Use Topics   Alcohol use: No    No family history on file.   Review of Systems  Musculoskeletal:  Positive for arthralgias.  All other systems reviewed and are negative.   Objective:  Physical Exam Constitutional:      General: She is not in acute distress.    Appearance: Normal appearance. She is not ill-appearing.  HENT:     Head: Normocephalic and atraumatic.     Right Ear: External ear normal.     Left Ear: External ear normal.     Nose: Nose normal.     Mouth/Throat:     Mouth: Mucous membranes are moist.     Pharynx: Oropharynx is clear.  Eyes:     Extraocular Movements: Extraocular movements intact.     Conjunctiva/sclera: Conjunctivae normal.  Cardiovascular:     Rate and Rhythm: Normal rate and regular rhythm.     Pulses: Normal pulses.     Heart sounds: Normal heart sounds.  Pulmonary:     Effort: Pulmonary effort is normal.     Breath sounds: Normal breath sounds.  Abdominal:     General: Bowel sounds are normal.     Palpations: Abdomen is soft.     Tenderness: There is no abdominal tenderness.  Musculoskeletal:        General: Tenderness present.     Cervical back: Normal range of motion and neck supple.     Comments: TTP over medial and lateral joint line,  medial worse than lateral.  No calf tenderness, swelling, or erythema.  No overlying lesions of area of chief complaint.  Decreased strength and ROM due to elicited pain.  Pre-operative ROM 0-115.  Dorsiflexion and plantarflexion intact.  Stable to varus and valgus stress, mild-moderate varus deformity.  BLE appear grossly neurovascularly intact.  Gait mildly antalgic.   Skin:    General: Skin is warm and dry.  Neurological:     Mental Status: She is alert and oriented to person, place, and time. Mental status is at baseline.  Psychiatric:        Mood and Affect: Mood normal.        Behavior: Behavior normal.     Vital signs in last 24 hours: @VSRANGES @  Labs:   Estimated body mass index is 33.29 kg/m as calculated from the following:   Height as of 07/12/12: 5\' 2"  (1.575 m).   Weight as of 07/12/12: 82.6 kg.   Imaging Review Plain radiographs demonstrate severe degenerative joint disease of the left knee(s). The overall alignment is mild-moderate varus deformity . The bone quality appears to be good for age and reported activity level.     Assessment/Plan:  End stage arthritis, left knee   The patient history, physical examination, clinical judgment of the provider and imaging studies are consistent with end stage degenerative joint disease of the left knee(s) and total knee arthroplasty is deemed medically necessary. The treatment options including medical management, injection therapy arthroscopy and arthroplasty were discussed at length. The risks and benefits of total knee arthroplasty were presented and reviewed. The risks due to aseptic loosening, infection, stiffness, patella tracking problems, thromboembolic complications and other imponderables were discussed. The patient acknowledged the explanation, agreed to proceed with the plan and consent was signed. Patient is being admitted for inpatient treatment for surgery, pain control, PT, OT, prophylactic antibiotics, VTE  prophylaxis, progressive ambulation and ADL's and discharge planning. The patient is planning to be discharged  outpatient PT    Patient's anticipated LOS is less than 2 midnights, meeting these requirements: - Younger than 95 - Lives within 1 hour of care - Has a competent adult at home to recover with post-op recover - NO history of  - Chronic pain requiring opiods  - Diabetes  - Coronary Artery Disease  - Heart failure  - Heart attack  - Stroke  - DVT/VTE  - Cardiac arrhythmia  - Respiratory Failure/COPD  - Renal failure  - Anemia  - Advanced Liver disease

## 2023-01-23 ENCOUNTER — Encounter (HOSPITAL_COMMUNITY): Payer: Self-pay

## 2023-01-23 ENCOUNTER — Encounter (HOSPITAL_COMMUNITY)
Admission: RE | Admit: 2023-01-23 | Discharge: 2023-01-23 | Disposition: A | Discharge status: Home / Self Care | Payer: No Typology Code available for payment source | Source: Ambulatory Visit | Attending: Orthopedic Surgery | Admitting: Orthopedic Surgery

## 2023-01-23 ENCOUNTER — Other Ambulatory Visit: Payer: Self-pay

## 2023-01-23 VITALS — BP 129/91 | HR 77 | Temp 97.8°F | Ht 63.0 in | Wt 182.0 lb

## 2023-01-23 DIAGNOSIS — I10 Essential (primary) hypertension: Secondary | ICD-10-CM | POA: Diagnosis not present

## 2023-01-23 DIAGNOSIS — M25562 Pain in left knee: Secondary | ICD-10-CM | POA: Diagnosis not present

## 2023-01-23 DIAGNOSIS — G8929 Other chronic pain: Secondary | ICD-10-CM | POA: Insufficient documentation

## 2023-01-23 DIAGNOSIS — Z01818 Encounter for other preprocedural examination: Secondary | ICD-10-CM | POA: Insufficient documentation

## 2023-01-23 DIAGNOSIS — E119 Type 2 diabetes mellitus without complications: Secondary | ICD-10-CM | POA: Diagnosis not present

## 2023-01-23 HISTORY — DX: Unspecified osteoarthritis, unspecified site: M19.90

## 2023-01-23 HISTORY — DX: Essential (primary) hypertension: I10

## 2023-01-23 HISTORY — DX: Pneumonia, unspecified organism: J18.9

## 2023-01-23 LAB — CBC WITH DIFFERENTIAL/PLATELET
Abs Immature Granulocytes: 0.02 10*3/uL (ref 0.00–0.07)
Basophils Absolute: 0 10*3/uL (ref 0.0–0.1)
Basophils Relative: 1 %
Eosinophils Absolute: 0.2 10*3/uL (ref 0.0–0.5)
Eosinophils Relative: 4 %
HCT: 40.2 % (ref 36.0–46.0)
Hemoglobin: 13.4 g/dL (ref 12.0–15.0)
Immature Granulocytes: 0 %
Lymphocytes Relative: 29 %
Lymphs Abs: 1.7 10*3/uL (ref 0.7–4.0)
MCH: 30.4 pg (ref 26.0–34.0)
MCHC: 33.3 g/dL (ref 30.0–36.0)
MCV: 91.2 fL (ref 80.0–100.0)
Monocytes Absolute: 0.7 10*3/uL (ref 0.1–1.0)
Monocytes Relative: 11 %
Neutro Abs: 3.2 10*3/uL (ref 1.7–7.7)
Neutrophils Relative %: 55 %
Platelets: 215 10*3/uL (ref 150–400)
RBC: 4.41 MIL/uL (ref 3.87–5.11)
RDW: 12.5 % (ref 11.5–15.5)
WBC: 5.8 10*3/uL (ref 4.0–10.5)
nRBC: 0 % (ref 0.0–0.2)

## 2023-01-23 LAB — SURGICAL PCR SCREEN
MRSA, PCR: NEGATIVE
Staphylococcus aureus: NEGATIVE

## 2023-01-23 LAB — COMPREHENSIVE METABOLIC PANEL
ALT: 108 U/L — ABNORMAL HIGH (ref 0–44)
AST: 60 U/L — ABNORMAL HIGH (ref 15–41)
Albumin: 4.1 g/dL (ref 3.5–5.0)
Alkaline Phosphatase: 77 U/L (ref 38–126)
Anion gap: 8 (ref 5–15)
BUN: 23 mg/dL (ref 8–23)
CO2: 25 mmol/L (ref 22–32)
Calcium: 9 mg/dL (ref 8.9–10.3)
Chloride: 104 mmol/L (ref 98–111)
Creatinine, Ser: 0.6 mg/dL (ref 0.44–1.00)
GFR, Estimated: >60 mL/min (ref >60)
Glucose, Bld: 137 mg/dL — ABNORMAL HIGH (ref 70–99)
Potassium: 4.3 mmol/L (ref 3.5–5.1)
Sodium: 137 mmol/L (ref 135–145)
Total Bilirubin: 0.5 mg/dL (ref 0.3–1.2)
Total Protein: 7.2 g/dL (ref 6.5–8.1)

## 2023-01-23 LAB — TYPE AND SCREEN
ABO/RH(D): B NEG
Antibody Screen: NEGATIVE

## 2023-01-23 LAB — GLUCOSE, CAPILLARY: Glucose-Capillary: 125 mg/dL — ABNORMAL HIGH (ref 70–99)

## 2023-01-23 NOTE — Progress Notes (Signed)
Labs results: ALT: 108

## 2023-01-23 NOTE — Progress Notes (Signed)
For Short Stay: COVID SWAB appointment date:  Bowel Prep reminder:   For Anesthesia: PCP - Macy Mis, MD  Cardiologist - N/A  Chest x-ray -  EKG - 01/23/23 Stress Test -  ECHO -  Cardiac Cath -  Pacemaker/ICD device last checked: Pacemaker orders received: Device Rep notified:  Spinal Cord Stimulator: N/A  Sleep Study - N/A CPAP -   Fasting Blood Sugar - 160's Checks Blood Sugar ___1__ times a day Date and result of last Hgb A1c-6.4: 12/02/22: CEW.  Last dose of GLP1 agonist- N/A GLP1 instructions:   Last dose of SGLT-2 inhibitors- N/A SGLT-2 instructions:   Blood Thinner Instructions: Pt. Was advised to call PCP and ask when Aspirin 325 mg. Can de hold before surgery. Aspirin Instructions: Last Dose:  Activity level: Can go up a flight of stairs and activities of daily living without stopping and without chest pain and/or shortness of breath   Able to exercise without chest pain and/or shortness of breath  Anesthesia review: Hx: HTN,DIA.  Patient denies shortness of breath, fever, cough and chest pain at PAT appointment   Patient verbalized understanding of instructions that were given to them at the PAT appointment. Patient was also instructed that they will need to review over the PAT instructions again at home before surgery.

## 2023-02-04 ENCOUNTER — Encounter (HOSPITAL_COMMUNITY)
Admission: RE | Disposition: A | Discharge status: Home / Self Care | Payer: Self-pay | Source: Home / Self Care | Attending: Orthopedic Surgery

## 2023-02-04 ENCOUNTER — Other Ambulatory Visit: Payer: Self-pay

## 2023-02-04 ENCOUNTER — Ambulatory Visit (HOSPITAL_BASED_OUTPATIENT_CLINIC_OR_DEPARTMENT_OTHER): Payer: No Typology Code available for payment source | Admitting: Anesthesiology

## 2023-02-04 ENCOUNTER — Ambulatory Visit (HOSPITAL_COMMUNITY): Payer: No Typology Code available for payment source

## 2023-02-04 ENCOUNTER — Ambulatory Visit (HOSPITAL_COMMUNITY)
Admission: RE | Admit: 2023-02-04 | Discharge: 2023-02-04 | Disposition: A | Discharge status: Home / Self Care | Payer: No Typology Code available for payment source | Attending: Orthopedic Surgery | Admitting: Orthopedic Surgery

## 2023-02-04 ENCOUNTER — Ambulatory Visit (HOSPITAL_COMMUNITY): Payer: No Typology Code available for payment source | Admitting: Physician Assistant

## 2023-02-04 ENCOUNTER — Encounter (HOSPITAL_COMMUNITY): Payer: Self-pay | Admitting: Orthopedic Surgery

## 2023-02-04 DIAGNOSIS — I10 Essential (primary) hypertension: Secondary | ICD-10-CM | POA: Insufficient documentation

## 2023-02-04 DIAGNOSIS — M1712 Unilateral primary osteoarthritis, left knee: Secondary | ICD-10-CM

## 2023-02-04 DIAGNOSIS — E119 Type 2 diabetes mellitus without complications: Secondary | ICD-10-CM | POA: Diagnosis not present

## 2023-02-04 DIAGNOSIS — Z87891 Personal history of nicotine dependence: Secondary | ICD-10-CM | POA: Insufficient documentation

## 2023-02-04 HISTORY — PX: TOTAL KNEE ARTHROPLASTY: SHX125

## 2023-02-04 LAB — GLUCOSE, CAPILLARY
Glucose-Capillary: 143 mg/dL — ABNORMAL HIGH (ref 70–99)
Glucose-Capillary: 142 mg/dL — ABNORMAL HIGH (ref 70–99)

## 2023-02-04 SURGERY — ARTHROPLASTY, KNEE, TOTAL
Anesthesia: Monitor Anesthesia Care | Site: Knee | Laterality: Left

## 2023-02-04 MED ORDER — PROPOFOL 1000 MG/100ML IV EMUL
INTRAVENOUS | Status: AC
  Filled 2023-02-04: qty 100

## 2023-02-04 MED ORDER — ORAL CARE MOUTH RINSE: 15.0000 mL | Freq: Once | OROMUCOSAL | Status: AC

## 2023-02-04 MED ORDER — BUPIVACAINE-EPINEPHRINE 0.25% -1:200000 IJ SOLN
INTRAMUSCULAR | Status: DC | PRN
  Administered 2023-02-04: 30 mL

## 2023-02-04 MED ORDER — SODIUM CHLORIDE (PF) 0.9 % IJ SOLN
INTRAMUSCULAR | Status: DC | PRN
  Administered 2023-02-04: 30 mL

## 2023-02-04 MED ORDER — MIDAZOLAM HCL 5 MG/5ML IJ SOLN
INTRAMUSCULAR | Status: DC | PRN
  Administered 2023-02-04: 1 mg via INTRAVENOUS

## 2023-02-04 MED ORDER — ACETAMINOPHEN 500 MG PO TABS
1000.0000 mg | ORAL_TABLET | Freq: Once | ORAL | Status: DC
  Filled 2023-02-04: qty 2

## 2023-02-04 MED ORDER — SODIUM CHLORIDE 0.9 % IV SOLN: INTRAVENOUS | Status: DC

## 2023-02-04 MED ORDER — FENTANYL CITRATE (PF) 100 MCG/2ML IJ SOLN
INTRAMUSCULAR | Status: AC
  Filled 2023-02-04: qty 2

## 2023-02-04 MED ORDER — ONDANSETRON HCL 4 MG PO TABS: 4.0000 mg | ORAL_TABLET | Freq: Three times a day (TID) | ORAL | 0 refills | Status: AC | PRN

## 2023-02-04 MED ORDER — LACTATED RINGERS IV BOLUS: 250.0000 mL | Freq: Once | INTRAVENOUS | Status: DC

## 2023-02-04 MED ORDER — TRANEXAMIC ACID-NACL 1000-0.7 MG/100ML-% IV SOLN
1000.0000 mg | INTRAVENOUS | Status: AC
  Administered 2023-02-04: 1000 mg via INTRAVENOUS
  Filled 2023-02-04: qty 100

## 2023-02-04 MED ORDER — OXYCODONE HCL 5 MG PO TABS: 5.0000 mg | ORAL_TABLET | ORAL | 0 refills | Status: AC | PRN

## 2023-02-04 MED ORDER — 0.9 % SODIUM CHLORIDE (POUR BTL) OPTIME
TOPICAL | Status: DC | PRN
  Administered 2023-02-04: 1000 mL

## 2023-02-04 MED ORDER — WATER FOR IRRIGATION, STERILE IR SOLN
Status: DC | PRN
  Administered 2023-02-04: 2000 mL

## 2023-02-04 MED ORDER — POVIDONE-IODINE 10 % EX SWAB: 2.0000 | Freq: Once | CUTANEOUS | Status: DC

## 2023-02-04 MED ORDER — OXYCODONE HCL 5 MG PO TABS: 5.0000 mg | ORAL_TABLET | Freq: Once | ORAL | Status: DC | PRN

## 2023-02-04 MED ORDER — BUPIVACAINE IN DEXTROSE 0.75-8.25 % IT SOLN
INTRATHECAL | Status: DC | PRN
Start: 2023-02-04 — End: 2023-02-04
  Administered 2023-02-04: 1.6 mL via INTRATHECAL

## 2023-02-04 MED ORDER — INSULIN ASPART 100 UNIT/ML IJ SOLN
0.0000 [IU] | INTRAMUSCULAR | Status: DC | PRN
  Administered 2023-02-04: 2 [IU] via SUBCUTANEOUS

## 2023-02-04 MED ORDER — INSULIN ASPART 100 UNIT/ML IJ SOLN
INTRAMUSCULAR | Status: AC
  Filled 2023-02-04: qty 1

## 2023-02-04 MED ORDER — MELOXICAM 15 MG PO TABS: ORAL_TABLET | ORAL | 2 refills | Status: DC

## 2023-02-04 MED ORDER — DEXAMETHASONE SODIUM PHOSPHATE 10 MG/ML IJ SOLN
INTRAMUSCULAR | Status: AC
  Filled 2023-02-04: qty 1

## 2023-02-04 MED ORDER — CEFAZOLIN SODIUM-DEXTROSE 2-4 GM/100ML-% IV SOLN
2.0000 g | INTRAVENOUS | Status: AC
  Administered 2023-02-04: 2 g via INTRAVENOUS
  Filled 2023-02-04: qty 100

## 2023-02-04 MED ORDER — SODIUM CHLORIDE (PF) 0.9 % IJ SOLN
INTRAMUSCULAR | Status: AC
  Filled 2023-02-04: qty 30

## 2023-02-04 MED ORDER — SODIUM CHLORIDE 0.9 % IR SOLN
Status: DC | PRN
  Administered 2023-02-04: 3000 mL

## 2023-02-04 MED ORDER — LACTATED RINGERS IV BOLUS
500.0000 mL | Freq: Once | INTRAVENOUS | Status: AC
  Administered 2023-02-04: 500 mL via INTRAVENOUS

## 2023-02-04 MED ORDER — LACTATED RINGERS IV SOLN: INTRAVENOUS | Status: DC

## 2023-02-04 MED ORDER — CEFAZOLIN SODIUM-DEXTROSE 2-4 GM/100ML-% IV SOLN: 2.0000 g | Freq: Four times a day (QID) | INTRAVENOUS | Status: DC

## 2023-02-04 MED ORDER — ROPIVACAINE HCL 5 MG/ML IJ SOLN
INTRAMUSCULAR | Status: DC | PRN
Start: 2023-02-04 — End: 2023-02-04
  Administered 2023-02-04: 25 mL via PERINEURAL

## 2023-02-04 MED ORDER — MIDAZOLAM HCL 2 MG/2ML IJ SOLN
INTRAMUSCULAR | Status: AC
  Filled 2023-02-04: qty 2

## 2023-02-04 MED ORDER — DEXAMETHASONE SODIUM PHOSPHATE 10 MG/ML IJ SOLN
8.0000 mg | Freq: Once | INTRAMUSCULAR | Status: AC
  Administered 2023-02-04: 8 mg via INTRAVENOUS

## 2023-02-04 MED ORDER — ONDANSETRON HCL 4 MG/2ML IJ SOLN
INTRAMUSCULAR | Status: AC
  Filled 2023-02-04: qty 2

## 2023-02-04 MED ORDER — OXYCODONE HCL 5 MG/5ML PO SOLN: 5.0000 mg | Freq: Once | ORAL | Status: DC | PRN

## 2023-02-04 MED ORDER — BUPIVACAINE LIPOSOME 1.3 % IJ SUSP: 20.0000 mL | Freq: Once | INTRAMUSCULAR | Status: DC

## 2023-02-04 MED ORDER — BUPIVACAINE LIPOSOME 1.3 % IJ SUSP
INTRAMUSCULAR | Status: DC | PRN
  Administered 2023-02-04: 20 mL

## 2023-02-04 MED ORDER — BUPIVACAINE-EPINEPHRINE 0.25% -1:200000 IJ SOLN
INTRAMUSCULAR | Status: AC
  Filled 2023-02-04: qty 1

## 2023-02-04 MED ORDER — ISOPROPYL ALCOHOL 70 % SOLN
Status: DC | PRN
  Administered 2023-02-04: 1 via TOPICAL

## 2023-02-04 MED ORDER — FENTANYL CITRATE (PF) 100 MCG/2ML IJ SOLN
INTRAMUSCULAR | Status: DC | PRN
  Administered 2023-02-04: 50 ug via INTRAVENOUS
  Administered 2023-02-04: 25 ug via INTRAVENOUS

## 2023-02-04 MED ORDER — ONDANSETRON HCL 4 MG/2ML IJ SOLN: 4.0000 mg | Freq: Four times a day (QID) | INTRAMUSCULAR | Status: DC | PRN

## 2023-02-04 MED ORDER — PHENYLEPHRINE HCL-NACL 20-0.9 MG/250ML-% IV SOLN
INTRAVENOUS | Status: DC | PRN
  Administered 2023-02-04: 30 ug/min via INTRAVENOUS

## 2023-02-04 MED ORDER — METHOCARBAMOL 500 MG PO TABS: 500.0000 mg | ORAL_TABLET | Freq: Three times a day (TID) | ORAL | 0 refills | Status: AC | PRN

## 2023-02-04 MED ORDER — ONDANSETRON HCL 4 MG/2ML IJ SOLN
INTRAMUSCULAR | Status: DC | PRN
  Administered 2023-02-04: 4 mg via INTRAVENOUS

## 2023-02-04 MED ORDER — METHOCARBAMOL 500 MG IVPB - SIMPLE MED: 500.0000 mg | Freq: Four times a day (QID) | INTRAVENOUS | Status: DC | PRN

## 2023-02-04 MED ORDER — METHOCARBAMOL 500 MG PO TABS: 500.0000 mg | ORAL_TABLET | Freq: Four times a day (QID) | ORAL | Status: DC | PRN

## 2023-02-04 MED ORDER — CHLORHEXIDINE GLUCONATE 0.12 % MT SOLN
15.0000 mL | Freq: Once | OROMUCOSAL | Status: AC
  Administered 2023-02-04: 15 mL via OROMUCOSAL

## 2023-02-04 MED ORDER — PROPOFOL 500 MG/50ML IV EMUL
INTRAVENOUS | Status: DC | PRN
  Administered 2023-02-04: 30 ug/kg/min via INTRAVENOUS
  Administered 2023-02-04 (×2): 30 mg via INTRAVENOUS

## 2023-02-04 MED ORDER — LACTATED RINGERS IV BOLUS
250.0000 mL | Freq: Once | INTRAVENOUS | Status: AC
  Administered 2023-02-04: 250 mL via INTRAVENOUS

## 2023-02-04 MED ORDER — FENTANYL CITRATE PF 50 MCG/ML IJ SOSY: 25.0000 ug | PREFILLED_SYRINGE | INTRAMUSCULAR | Status: DC | PRN

## 2023-02-04 MED ORDER — BUPIVACAINE LIPOSOME 1.3 % IJ SUSP
INTRAMUSCULAR | Status: AC
  Filled 2023-02-04: qty 20

## 2023-02-04 SURGICAL SUPPLY — 69 items
ADH SKN CLS APL DERMABOND .7 (GAUZE/BANDAGES/DRESSINGS) ×1
ADH SKN CLS LQ APL DERMABOND (GAUZE/BANDAGES/DRESSINGS) ×1
APL PRP STRL LF DISP 70% ISPRP (MISCELLANEOUS) ×2
BAG COUNTER SPONGE SURGICOUNT (BAG) IMPLANT
BAG SPNG CNTER NS LX DISP (BAG)
BLADE SAG 18X100X1.27 (BLADE) ×1 IMPLANT
BLADE SAW SAG 35X64 .89 (BLADE) ×1 IMPLANT
BNDG CMPR 5X3 CHSV STRCH STRL (GAUZE/BANDAGES/DRESSINGS) ×1
BNDG CMPR MED 10X6 ELC LF (GAUZE/BANDAGES/DRESSINGS) ×1
BNDG COHESIVE 3X5 TAN ST LF (GAUZE/BANDAGES/DRESSINGS) ×1 IMPLANT
BNDG ELASTIC 6X10 VLCR STRL LF (GAUZE/BANDAGES/DRESSINGS) ×1 IMPLANT
BOWL SMART MIX CTS (DISPOSABLE) ×1 IMPLANT
BSPLAT TIB 5D E CMNT STM LT (Knees) ×1 IMPLANT
CEMENT BONE R 1X40 (Cement) IMPLANT
CEMENT BONE REFOBACIN R1X40 US (Cement) IMPLANT
CHLORAPREP W/TINT 26 (MISCELLANEOUS) ×2 IMPLANT
COVER SURGICAL LIGHT HANDLE (MISCELLANEOUS) ×1 IMPLANT
CUFF TOURN SGL QUICK 34 (TOURNIQUET CUFF) ×1
CUFF TRNQT CYL 34X4.125X (TOURNIQUET CUFF) ×1 IMPLANT
DERMABOND ADVANCED .7 DNX12 (GAUZE/BANDAGES/DRESSINGS) ×1 IMPLANT
DERMABOND ADVANCED .7 DNX6 (GAUZE/BANDAGES/DRESSINGS) IMPLANT
DRAPE INCISE IOBAN 85X60 (DRAPES) ×1 IMPLANT
DRAPE SHEET LG 3/4 BI-LAMINATE (DRAPES) ×1 IMPLANT
DRAPE U-SHAPE 47X51 STRL (DRAPES) ×1 IMPLANT
DRESSING AQUACEL AG SP 3.5X10 (GAUZE/BANDAGES/DRESSINGS) ×1 IMPLANT
DRSG AQUACEL AG ADV 3.5X10 (GAUZE/BANDAGES/DRESSINGS) IMPLANT
DRSG AQUACEL AG SP 3.5X10 (GAUZE/BANDAGES/DRESSINGS) ×1
ELECT REM PT RETURN 15FT ADLT (MISCELLANEOUS) ×1 IMPLANT
FEMUR CMT CR STD SZ 6 LT KNEE (Joint) ×1 IMPLANT
FEMUR CMTD CR STD SZ 6 LT KNEE (Joint) IMPLANT
GAUZE SPONGE 4X4 12PLY STRL (GAUZE/BANDAGES/DRESSINGS) ×1 IMPLANT
GLOVE BIO SURGEON STRL SZ 6.5 (GLOVE) ×2 IMPLANT
GLOVE BIOGEL PI IND STRL 6.5 (GLOVE) ×1 IMPLANT
GLOVE BIOGEL PI IND STRL 8 (GLOVE) ×1 IMPLANT
GLOVE SURG ORTHO 8.0 STRL STRW (GLOVE) ×2 IMPLANT
GOWN STRL REUS W/ TWL XL LVL3 (GOWN DISPOSABLE) ×2 IMPLANT
GOWN STRL REUS W/TWL XL LVL3 (GOWN DISPOSABLE) ×2
HANDPIECE INTERPULSE COAX TIP (DISPOSABLE) ×1
HOLDER FOLEY CATH W/STRAP (MISCELLANEOUS) ×1 IMPLANT
HOOD PEEL AWAY T7 (MISCELLANEOUS) ×3 IMPLANT
KIT TURNOVER KIT A (KITS) IMPLANT
MANIFOLD NEPTUNE II (INSTRUMENTS) ×1 IMPLANT
MARKER SKIN DUAL TIP RULER LAB (MISCELLANEOUS) ×1 IMPLANT
NS IRRIG 1000ML POUR BTL (IV SOLUTION) ×1 IMPLANT
PACK TOTAL KNEE CUSTOM (KITS) ×1 IMPLANT
PIN DRILL HDLS TROCAR 75 4PK (PIN) IMPLANT
SCREW HEADED 33MM KNEE (MISCELLANEOUS) IMPLANT
SET HNDPC FAN SPRY TIP SCT (DISPOSABLE) ×1 IMPLANT
SOLUTION IRRIG SURGIPHOR (IV SOLUTION) IMPLANT
SPIKE FLUID TRANSFER (MISCELLANEOUS) ×1 IMPLANT
STEM POLY PAT PLY 32M KNEE (Knees) IMPLANT
STEM TIBIA 5 DEG SZ E L KNEE (Knees) IMPLANT
STEM TIBIAL SZ6-7 EF 12 LT (Knees) IMPLANT
STRIP CLOSURE SKIN 1/2X4 (GAUZE/BANDAGES/DRESSINGS) ×1 IMPLANT
SUT MNCRL AB 3-0 PS2 18 (SUTURE) ×1 IMPLANT
SUT STRATAFIX 0 PDS 27 VIOLET (SUTURE) ×1
SUT STRATAFIX PDO 1 14 VIOLET (SUTURE) ×1
SUT STRATFX PDO 1 14 VIOLET (SUTURE) ×1
SUT VIC AB 2-0 CT2 27 (SUTURE) ×2 IMPLANT
SUT VLOC 180 0 24IN GS25 (SUTURE) IMPLANT
SUT VLOC 180 ABS0 18IN GS21 (SUTURE) ×1 IMPLANT
SUTURE STRATFX 0 PDS 27 VIOLET (SUTURE) ×1 IMPLANT
SUTURE STRATFX PDO 1 14 VIOLET (SUTURE) ×1 IMPLANT
SYR 50ML LL SCALE MARK (SYRINGE) ×1 IMPLANT
TIBIA STEM 5 DEG SZ E L KNEE (Knees) ×1 IMPLANT
TRAY FOLEY MTR SLVR 14FR STAT (SET/KITS/TRAYS/PACK) IMPLANT
TUBE SUCTION HIGH CAP CLEAR NV (SUCTIONS) ×1 IMPLANT
UNDERPAD 30X36 HEAVY ABSORB (UNDERPADS AND DIAPERS) ×1 IMPLANT
WRAP KNEE MAXI GEL POST OP (GAUZE/BANDAGES/DRESSINGS) IMPLANT

## 2023-02-04 NOTE — Op Note (Signed)
DATE OF SURGERY:  02/04/2023 TIME: 9:13 AM  PATIENT NAME:  Tonya Farrell   AGE: 62 y.o.    PRE-OPERATIVE DIAGNOSIS: End-stage left knee osteoarthritis  POST-OPERATIVE DIAGNOSIS:  Same  PROCEDURE: Left total Knee Arthroplasty  SURGEON:  Sharita Bienaime A Bellarose Burtt, MD   ASSISTANT: Darron Doom, RNFA, present and scrubbed throughout the case, critical for assistance with exposure, retraction, instrumentation, and closure.   OPERATIVE IMPLANTS:  Cemented Zimmer persona size left 6 standard, E tibial baseplate cemented, 12 mm MC poly insert, 32 mm patella Implant Name Type Inv. Item Serial No. Manufacturer Lot No. LRB No. Used Action  CEMENT BONE REFOBACIN [id number] [place] - [id number] Cement CEMENT BONE REFOBACIN R1X40 [place]  ZIMMER RECON(ORTH,TRAU,BIO,SG) [id number] Left 2 Implanted  FEMUR CMT CR STD SZ 6 LT KNEE - [id number] Joint FEMUR CMT CR STD SZ 6 LT KNEE  ZIMMER RECON(ORTH,TRAU,BIO,SG) [id number] Left 1 Implanted  STEM POLY PAT PLY 63M KNEE - [id number] Knees STEM POLY PAT PLY 63M KNEE  ZIMMER RECON(ORTH,TRAU,BIO,SG) [id number] Left 1 Implanted  BSPLAT TIB 5D E CMNT STM LT - [id number] Knees BSPLAT TIB 5D E CMNT STM LT  ZIMMER RECON(ORTH,TRAU,BIO,SG) [id number] Left 1 Implanted  STEM TIBIAL SZ6-7 EF 12 LT - [id number] Knees STEM TIBIAL SZ6-7 EF 12 LT  ZIMMER RECON(ORTH,TRAU,BIO,SG) [id number] Left 1 Implanted      PREOPERATIVE INDICATIONS:  Tonya Farrell is a 62 y.o. year old female with end stage bone on bone degenerative arthritis of the knee who failed conservative treatment, including injections, antiinflammatories, activity modification, and assistive devices, and had significant impairment of their activities of daily living, and elected for Total Knee Arthroplasty.   The risks, benefits, and alternatives were discussed at length including but not limited to the risks of infection, bleeding, nerve injury, stiffness, blood clots, the need for revision surgery, cardiopulmonary complications, among  others, and they were willing to proceed.  ESTIMATED BLOOD LOSS: 50cc  OPERATIVE DESCRIPTION:   Once adequate anesthesia was induced, preoperative antibiotics, 2 gm of ancef,1 gm of Tranexamic Acid, and 8 mg of Decadron administered, the patient was positioned supine with a left thigh tourniquet placed.  The left lower extremity was prepped and draped in sterile fashion.  A time-  out was performed identifying the patient, planned procedure, and the appropriate extremity.     The leg was  exsanguinated, tourniquet elevated to 250 mmHg.  A midline incision was  made followed by median parapatellar arthrotomy. Anterior horn of the medial meniscus was released and resected. A medial release was performed, the infrapatellar fat pad was resected with care taken to protect the patellar tendon. The suprapatellar fat was removed to exposed the distal anterior femur. The anterior horn of the lateral meniscus and ACL were released.    Following initial  exposure, I first started with the femur  The femoral  canal was opened with a drill, canal was suctioned to try to prevent fat emboli.  An  intramedullary rod was passed set at 6 degrees valgus, 10 mm. The distal femur was resected.  Following this resection, the tibia was  subluxated anteriorly.  Using the extramedullary guide, 10 mm of bone was resected off   the proximal lateral tibia.  We confirmed the gap would be  stable medially and laterally with a size 10mm spacer block as well as confirmed that the tibial cut was perpendicular in the coronal plane, checking with an alignment rod.    Once this was done, the posterior femoral referencing femoral sizer was placed  under to the posterior condyles with 3 degrees of external rotational which was parallel to the transepicondylar axis and perpendicular to [name]. The femur was sized to be a size 6 in the anterior-  posterior dimension. The  anterior, posterior, and  chamfer cuts were made without  difficulty nor   notching making certain that I was along the anterior cortex to help  with flexion gap stability. Next a laminar spreader was placed with the knee in flexion and the medial lateral menisci were resected.  5 cc of the Exparel mixture was injected in the medial side of the back of the knee and 3 cc in the lateral side.  1/2 inch curved osteotome was used to resect posterior osteophyte that was then removed with a pituitary rongeur.       At this point, the tibia was sized to be a size E.  The size E tray was  then pinned in position. Trial reduction was now carried with a 6 femur, E tibia, a 10 mm MC insert. There was some hyperextension and laxity with 10mm poly trial improved with 12mm insert. The knee had full extension and was stable to varus valgus stress in extension.  The knee was slightly tight in flexion and the PCL was partially released.   Attention was next directed to the patella.  Precut  measurement was noted to be 24 mm.  I resected down to 14 mm and used a  32mm patellar button to restore patellar height as well as cover the cut surface.     The patella lug holes were drilled and a 32mm patella poly trial was placed.    The knee was brought to full extension with good flexion stability with the patella tracking through the trochlea without application of pressure.     Next the femoral component was again assessed and determined to be seated and appropriately lateralized.  The femoral lug holes were drilled.  The femoral component was then removed. Tibial component was again assessed and felt to be seated and appropriately rotated with the medial third of the tubercle. The tibia was then drilled, and keel punched.     Final components were  opened and antibiotic cement was mixed.      Final implants were then  cemented onto cleaned and dried cut surfaces of bone with the knee brought to extension with a 12 mm MC poly.  The knee was irrigated with sterile Betadine  diluted in saline as well as pulse lavage normal saline.  The synovial lining was  then injected a dilute Exparel with 30cc of 0.25% marcaine with epinephrine.         Once the cement had fully cured, excess cement was removed throughout the knee.  I confirmed that I was satisfied with the range of motion and stability, and the final 12mm MC poly insert was chosen.  It was placed into the knee.         The tourniquet had been let down at 65 minutes.  No significant hemostasis was required.  The medial parapatellar arthrotomy was then reapproximated using #1 Stratafix sutures with the knee  in flexion.  The remaining wound was closed with 0 stratafix, 2-0 Vicryl, and running 3-0 Monocryl. The knee was cleaned, dried, dressed sterilely using Dermabond and   Aquacel dressing.  The patient was then brought to recovery room in stable condition, tolerating the procedure  well. There were no complications.   Post op recs: WB: WBAT  Abx: ancef Imaging: PACU xrays DVT prophylaxis: Resume aspirin 325mg  starting POD1 Follow up: 2 weeks after surgery for a wound check with Dr. Blanchie Dessert at [facility]  Address: [address], [place], [place] [address]  Office Phone: [phone]  Weber Cooks, MD Orthopaedic Surgery

## 2023-02-04 NOTE — Anesthesia Procedure Notes (Signed)
Anesthesia Regional Block: Adductor canal block   Pre-Anesthetic Checklist: , timeout performed,  Correct Patient, Correct Site, Correct Laterality,  Correct Procedure, Correct Position, site marked,  Risks and benefits discussed,  Surgical consent,  Pre-op evaluation,  At surgeon's request and post-op pain management  Laterality: Left  Prep: chloraprep       Needles:  Injection technique: Single-shot  Needle Type: Echogenic Needle     Needle Length: 9cm  Needle Gauge: 21     Additional Needles:   Narrative:  Start time: 02/04/2023 7:03 AM End time: 02/04/2023 7:14 AM Injection made incrementally with aspirations every 5 mL.  Performed by: Personally  Anesthesiologist: Achille Rich, MD  Additional Notes: Pt tolerated the procedure well.

## 2023-02-04 NOTE — Anesthesia Procedure Notes (Signed)
Spinal  Patient location during procedure: OR Start time: 02/04/2023 7:28 AM End time: 02/04/2023 7:29 AM Reason for block: surgical anesthesia Staffing Performed: anesthesiologist  Anesthesiologist: Achille Rich, MD Performed by: Achille Rich, MD Authorized by: Achille Rich, MD   Preanesthetic Checklist Completed: patient identified, IV checked, risks and benefits discussed, surgical consent, monitors and equipment checked, pre-op evaluation and timeout performed Spinal Block Patient position: sitting Prep: DuraPrep Patient monitoring: cardiac monitor, continuous pulse ox and blood pressure Approach: midline Location: L3-4 Injection technique: single-shot Needle Needle type: Pencan  Needle gauge: 24 G Needle length: 9 cm Assessment Sensory level: T10 Events: CSF return Additional Notes Functioning IV was confirmed and monitors were applied. Sterile prep and drape, including hand hygiene and sterile gloves were used. The patient was positioned and the spine was prepped. The skin was anesthetized with lidocaine.  Free flow of clear CSF was obtained prior to injecting local anesthetic into the CSF.  The spinal needle aspirated freely following injection.  The needle was carefully withdrawn.  The patient tolerated the procedure well.

## 2023-02-04 NOTE — Progress Notes (Signed)
Orthopedic Tech Progress Note Patient Details:  Tonya Farrell 09-Dec-1960 [id number]  Ortho Devices Type of Ortho Device: Bone foam zero knee Ortho Device/Splint Location: left Ortho Device/Splint Interventions: Ordered, Application, Adjustment   Post Interventions Patient Tolerated: Well Instructions Provided: Adjustment of device, Care of device  [name] 02/04/2023, 9:50 AM

## 2023-02-04 NOTE — Interval H&P Note (Signed)
The patient has been re-examined, and the chart reviewed, and there have been no interval changes to the documented history and physical.    Plan for L TKA for L knee OA  The operative side was examined and the patient was confirmed to have sensation to DPN, SPN, TN intact, Motor EHL, ext, flex 5/5, and DP 2+, PT 2+, No significant edema.   The risks, benefits, and alternatives have been discussed at length with patient, and the patient is willing to proceed.  Left knee marked. Consent has been signed.  

## 2023-02-04 NOTE — Anesthesia Preprocedure Evaluation (Signed)
Anesthesia Evaluation  Patient identified by MRN, date of birth, ID band Patient awake    Reviewed: Allergy & Precautions, H&P , NPO status , Patient's Chart, lab work & pertinent test results  Airway Mallampati: II   Neck ROM: full    Dental   Pulmonary former smoker   breath sounds clear to auscultation       Cardiovascular hypertension,  Rhythm:regular Rate:Normal     Neuro/Psych    GI/Hepatic   Endo/Other  diabetes, Type 2    Renal/GU      Musculoskeletal  (+) Arthritis ,    Abdominal   Peds  Hematology   Anesthesia Other Findings   Reproductive/Obstetrics                             Anesthesia Physical Anesthesia Plan  ASA: 2  Anesthesia Plan: MAC and Spinal   Post-op Pain Management: Regional block*   Induction: Intravenous  PONV Risk Score and Plan: 2 and Propofol infusion and Treatment may vary due to age or medical condition  Airway Management Planned: Simple Face Mask  Additional Equipment:   Intra-op Plan:   Post-operative Plan: Extubation in OR  Informed Consent: I have reviewed the patients History and Physical, chart, labs and discussed the procedure including the risks, benefits and alternatives for the proposed anesthesia with the patient or authorized representative who has indicated his/her understanding and acceptance.     Dental advisory given  Plan Discussed with: CRNA, Anesthesiologist and Surgeon  Anesthesia Plan Comments:        Anesthesia Quick Evaluation

## 2023-02-04 NOTE — Transfer of Care (Signed)
Immediate Anesthesia Transfer of Care Note  Patient: Tonya Farrell  Procedure(s) Performed: TOTAL KNEE ARTHROPLASTY (Left: Knee)  Patient Location: PACU  Anesthesia Type:Spinal  Level of Consciousness: awake and patient cooperative  Airway & Oxygen Therapy: Patient Spontanous Breathing and Patient connected to face mask oxygen  Post-op Assessment: Report given to RN and Post -op Vital signs reviewed and stable  Post vital signs: Reviewed and stable  Last Vitals:  Vitals Value Taken Time  BP 135/68 02/04/23 0934  Temp    Pulse 81 02/04/23 0936  Resp 15 02/04/23 0936  SpO2 97 % 02/04/23 0936  Vitals shown include unfiled device data.  Last Pain:  Vitals:   02/04/23 0550  TempSrc: Oral  PainSc: 8          Complications: No notable events documented.

## 2023-02-04 NOTE — Evaluation (Signed)
Physical Therapy Evaluation Patient Details Name: Tonya Farrell MRN: [id number] DOB: 08/07/1960 Today's Date: 02/04/2023  History of Present Illness  62 yo female presents to therapy s/p L TKA on 02/04/2023 due to failure of conservative measures. Pt PMH includes but is not limited to: R foot surgery, DM II,  HTN, HLD, tobacco abuse and onycholysis.  Clinical Impression     Tonya Farrell is a 62 y.o. female POD 0 s/p L TKA. Patient reports IND with mobility at baseline. Patient is now limited by functional impairments (see PT problem list below) and requires CGA and cues for transfers and gait with RW. Patient was able to ambulate 45 and 50 feet with RW and CGA and cues for safe walker management. Patient educated on safe sequencing for stair mobility with a variety of options for family to safely assist pt up 3 steps without handrail to enter home pt and son performing return demonstration, fall risk prevention, pain management and goal, use of CP/ice and car transfers pt and son  verbalized understanding of safe guarding position for people assisting with mobility. Patient instructed in exercises to facilitate ROM and circulation reviewed and HO provided. Patient will benefit from continued skilled PT interventions to address impairments and progress towards PLOF. Patient has met mobility goals at adequate level for discharge home with family support and OPPT services; will continue to follow if pt continues acute stay to progress towards Mod I goals.       If plan is discharge home, recommend the following: A little help with walking and/or transfers;A little help with bathing/dressing/bathroom;Assistance with cooking/housework;Assist for transportation;Help with stairs or ramp for entrance   Can travel by private vehicle        Equipment Recommendations None recommended by PT  Recommendations for Other Services       Functional Status Assessment Patient has had a recent decline in  their functional status and demonstrates the ability to make significant improvements in function in a reasonable and predictable amount of time.     Precautions / Restrictions Precautions Precautions: Knee;Fall Restrictions Weight Bearing Restrictions: No      Mobility  Bed Mobility Overal bed mobility: Needs Assistance Bed Mobility: Supine to Sit     Supine to sit: Contact guard     General bed mobility comments: min cues    Transfers Overall transfer level: Needs assistance Equipment used: Rolling walker (2 wheels) Transfers: Sit to/from Stand Sit to Stand: Contact guard assist           General transfer comment: min cues for safety and proper UE and AD placement with bed, recliner and commode transfers    Ambulation/Gait Ambulation/Gait assistance: Contact guard assist Gait Distance (Feet): 45 Feet Assistive device: Rolling walker (2 wheels) Gait Pattern/deviations: Step-to pattern, Antalgic, Trunk flexed       General Gait Details: ceus for safety and technique wtih gait pattern  Stairs Stairs: Yes Stairs assistance: Contact guard assist Stair Management: Two rails Number of Stairs: 3 General stair comments: pt and son step traning 3 steps with B handrail and CGA with cues for sequencing and technique. PT discussed several options to enter home navigating 3 without handrails RW placement, B handheld assist and HHA x 1 and use of SPC. PT had son engage and pt and family performed return demonstration wtih B HHA with min A and cues  Wheelchair Mobility     Tilt Bed    Modified Rankin (Stroke Patients Only)  Balance Overall balance assessment: Needs assistance Sitting-balance support: Feet supported Sitting balance-Leahy Scale: Good     Standing balance support: Bilateral upper extremity supported, During functional activity, Reliant on assistive device for balance Standing balance-Leahy Scale: Fair Standing balance comment: static standing  no UE support                             Pertinent Vitals/Pain Pain Assessment Pain Assessment: 0-10 Pain Score: 3  Pain Location: L knee Pain Descriptors / Indicators: Aching, Constant, Discomfort, Operative site guarding Pain Intervention(s): Monitored during session, Limited activity within patient's tolerance, Premedicated before session, Repositioned, Ice applied    Home Living Family/patient expects to be discharged to:: Private residence Living Arrangements: Children;Other relatives Available Help at Discharge: Family Type of Home: House Home Access: Stairs to enter Entrance Stairs-Rails: None Entrance Stairs-Number of Steps: 3   Home Layout: One level Home Equipment: Agricultural consultant (2 wheels);Crutches;Cane - single point      Prior Function Prior Level of Function : Independent/Modified Independent;Working/employed             Mobility Comments: IND no AD for all ADLs, self care tasks, IADLs       Extremity/Trunk Assessment        Lower Extremity Assessment Lower Extremity Assessment: LLE deficits/detail LLE Deficits / Details: ankle DF/PF 5/5; SLR < 10 degrees lag LLE Sensation: WNL    Cervical / Trunk Assessment Cervical / Trunk Assessment: Normal  Communication   Communication Communication: Other (comment) (son assisted with interpreting)  Cognition Arousal: Alert Behavior During Therapy: WFL for tasks assessed/performed Overall Cognitive Status: Within Functional Limits for tasks assessed                                          General Comments      Exercises Total Joint Exercises Ankle Circles/Pumps: AROM, Both, 20 reps Quad Sets: AROM, Left, 5 reps Short Arc Quad: AROM, Left, 5 reps Heel Slides: AROM, Left, 5 reps Hip ABduction/ADduction: AROM, Left, 5 reps Straight Leg Raises: AROM, Left, 5 reps Knee Flexion: AROM, Left, 5 reps   Assessment/Plan    PT Assessment Patient needs continued PT services   PT Problem List Decreased strength;Decreased range of motion;Decreased activity tolerance;Decreased balance;Decreased mobility;Decreased coordination;Pain       PT Treatment Interventions DME instruction;Gait training;Stair training;Functional mobility training;Therapeutic activities;Therapeutic exercise;Balance training;Neuromuscular re-education;Patient/family education;Modalities    PT Goals (Current goals can be found in the Care Plan section)  Acute Rehab PT Goals Patient Stated Goal: do regular stuff at home including gardening PT Goal Formulation: With patient Time For Goal Achievement: 02/18/23 Potential to Achieve Goals: Good    Frequency 7X/week     Co-evaluation               AM-PAC PT "6 Clicks" Mobility  Outcome Measure Help needed turning from your back to your side while in a flat bed without using bedrails?: None Help needed moving from lying on your back to sitting on the side of a flat bed without using bedrails?: A Little Help needed moving to and from a bed to a chair (including a wheelchair)?: A Little Help needed standing up from a chair using your arms (e.g., wheelchair or bedside chair)?: A Little Help needed to walk in hospital room?: A Little Help needed climbing 3-5 steps with a  railing? : A Little 6 Click Score: 19    End of Session Equipment Utilized During Treatment: Gait belt Activity Tolerance: Patient tolerated treatment well Patient left: in chair;with call bell/phone within reach Nurse Communication: Mobility status;Other (comment) (pt readiness for d/c from PT perspective) PT Visit Diagnosis: Unsteadiness on feet (R26.81);Other abnormalities of gait and mobility (R26.89);Muscle weakness (generalized) (M62.81);Pain Pain - Right/Left: Left Pain - part of body: Knee;Leg    Time: 2130-8657 PT Time Calculation (min) (ACUTE ONLY): 41 min   Charges:   PT Evaluation $PT Eval Low Complexity: 1 Low PT Treatments $Gait Training: 8-22  mins $Therapeutic Exercise: 8-22 mins PT General Charges $$ ACUTE PT VISIT: 1 Visit         Johnny Bridge, PT Acute Rehab   Jacqualyn Posey 02/04/2023, 1:44 PM

## 2023-02-04 NOTE — Progress Notes (Signed)
Preop assessment done with interpreter present, Tonya Farrell.

## 2023-02-04 NOTE — Discharge Instructions (Addendum)
INSTRUCTIONS AFTER JOINT REPLACEMENT   Remove items at home which could result in a fall. This includes throw rugs or furniture in walking pathways ICE to the affected joint every three hours while awake for 30 minutes at a time, for at least the first 3-5 days, and then as needed for pain and swelling.  Continue to use ice for pain and swelling. You may notice swelling that will progress down to the foot and ankle.  This is normal after surgery.  Elevate your leg when you are not up walking on it.   Continue to use the breathing machine you got in the hospital (incentive spirometer) which will help keep your temperature down.  It is common for your temperature to cycle up and down following surgery, especially at night when you are not up moving around and exerting yourself.  The breathing machine keeps your lungs expanded and your temperature down.   DIET:  As you were doing prior to hospitalization, we recommend a well-balanced diet.  DRESSING / WOUND CARE / SHOWERING  Keep the surgical dressing until follow up.  The dressing is water proof, so you can shower without any extra covering.  IF THE DRESSING FALLS OFF or the wound gets wet inside, change the dressing with sterile gauze.  Please use good hand washing techniques before changing the dressing.  Do not use any lotions or creams on the incision until instructed by your surgeon.    ACTIVITY  Increase activity slowly as tolerated, but follow the weight bearing instructions below.   No driving for 6 weeks or until further direction given by your physician.  You cannot drive while taking narcotics.  No lifting or carrying greater than 10 lbs. until further directed by your surgeon. Avoid periods of inactivity such as sitting longer than an hour when not asleep. This helps prevent blood clots.  You may return to work once you are authorized by your doctor.     WEIGHT BEARING   Weight bearing as tolerated with assist device (walker, cane,  etc) as directed, use it as long as suggested by your surgeon or therapist, typically at least 4-6 weeks.   EXERCISES  Results after joint replacement surgery are often greatly improved when you follow the exercise, range of motion and muscle strengthening exercises prescribed by your doctor. Safety measures are also important to protect the joint from further injury. Any time any of these exercises cause you to have increased pain or swelling, decrease what you are doing until you are comfortable again and then slowly increase them. If you have problems or questions, call your caregiver or physical therapist for advice.   Rehabilitation is important following a joint replacement. After just a few days of immobilization, the muscles of the leg can become weakened and shrink (atrophy).  These exercises are designed to build up the tone and strength of the thigh and leg muscles and to improve motion. Often times heat used for twenty to thirty minutes before working out will loosen up your tissues and help with improving the range of motion but do not use heat for the first two weeks following surgery (sometimes heat can increase post-operative swelling).   These exercises can be done on a training (exercise) mat, on the floor, on a table or on a bed. Use whatever works the best and is most comfortable for you.    Use music or television while you are exercising so that the exercises are a pleasant break in your  day. This will make your life better with the exercises acting as a break in your routine that you can look forward to.   Perform all exercises about fifteen times, three times per day or as directed.  You should exercise both the operative leg and the other leg as well.  Exercises include:   Quad Sets - Tighten up the muscle on the front of the thigh (Quad) and hold for 5-10 seconds.   Straight Leg Raises - With your knee straight (if you were given a brace, keep it on), lift the leg to 60  degrees, hold for 3 seconds, and slowly lower the leg.  Perform this exercise against resistance later as your leg gets stronger.  Leg Slides: Lying on your back, slowly slide your foot toward your buttocks, bending your knee up off the floor (only go as far as is comfortable). Then slowly slide your foot back down until your leg is flat on the floor again.  Angel Wings: Lying on your back spread your legs to the side as far apart as you can without causing discomfort.  Hamstring Strength:  Lying on your back, push your heel against the floor with your leg straight by tightening up the muscles of your buttocks.  Repeat, but this time bend your knee to a comfortable angle, and push your heel against the floor.  You may put a pillow under the heel to make it more comfortable if necessary.   A rehabilitation program following joint replacement surgery can speed recovery and prevent re-injury in the future due to weakened muscles. Contact your doctor or a physical therapist for more information on knee rehabilitation.    CONSTIPATION  Constipation is defined medically as fewer than three stools per week and severe constipation as less than one stool per week.  Even if you have a regular bowel pattern at home, your normal regimen is likely to be disrupted due to multiple reasons following surgery.  Combination of anesthesia, postoperative narcotics, change in appetite and fluid intake all can affect your bowels.   YOU MUST use at least one of the following options; they are listed in order of increasing strength to get the job done.  They are all available over the counter, and you may need to use some, POSSIBLY even all of these options:    Drink plenty of fluids (prune juice may be helpful) and high fiber foods Colace 100 mg by mouth twice a day  Senokot for constipation as directed and as needed Dulcolax (bisacodyl), take with full glass of water  Miralax (polyethylene glycol) once or twice a day as  needed.  If you have tried all these things and are unable to have a bowel movement in the first 3-4 days after surgery call either your surgeon or your primary doctor.    If you experience loose stools or diarrhea, hold the medications until you stool forms back up.  If your symptoms do not get better within 1 week or if they get worse, check with your doctor.  If you experience "the worst abdominal pain ever" or develop nausea or vomiting, please contact the office immediately for further recommendations for treatment.   ITCHING:  If you experience itching with your medications, try taking only a single pain pill, or even half a pain pill at a time.  You can also use Benadryl over the counter for itching or also to help with sleep.   TED HOSE STOCKINGS:  Use stockings on both  legs until for at least 2 weeks or as directed by physician office. They may be removed at night for sleeping.  MEDICATIONS:  See your medication summary on the "After Visit Summary" that nursing will review with you.  You may have some home medications which will be placed on hold until you complete the course of blood thinner medication.  It is important for you to complete the blood thinner medication as prescribed.   Blood clot prevention (DVT Prophylaxis): After surgery you are at an increased risk for a blood clot. you were prescribed a blood thinner, resume aspirin after surgery to help reduce your risk of getting a blood clot.  Signs of a pulmonary embolus (blood clot in the lungs) include sudden short of breath, feeling lightheaded or dizzy, chest pain with a deep breath, rapid pulse rapid breathing. Signs of a blood clot in your arms or legs include new unexplained swelling and cramping, warm, red or darkened skin around the painful area. Please call the office or 911 right away if these signs or symptoms develop.  PRECAUTIONS:  If you experience chest pain or shortness of breath - call 911 immediately for transfer  to the hospital emergency department.   If you develop a fever greater that 101 F, purulent drainage from wound, increased redness or drainage from wound, foul odor from the wound/dressing, or calf pain - CONTACT YOUR SURGEON.                                                   FOLLOW-UP APPOINTMENTS:  If you do not already have a post-op appointment, please call the office for an appointment to be seen by your surgeon.  Guidelines for how soon to be seen are listed in your "After Visit Summary", but are typically between 2-3 weeks after surgery.  OTHER INSTRUCTIONS:   Knee Replacement:  Do not place pillow under knee, focus on keeping the knee straight while resting.   POST-OPERATIVE OPIOID TAPER INSTRUCTIONS: It is important to wean off of your opioid medication as soon as possible. If you do not need pain medication after your surgery it is ok to stop day one. Opioids include: Codeine, Hydrocodone(Norco, Vicodin), Oxycodone(Percocet, oxycontin) and hydromorphone amongst others.  Long term and even short term use of opiods can cause: Increased pain response Dependence Constipation Depression Respiratory depression And more.  Withdrawal symptoms can include Flu like symptoms Nausea, vomiting And more Techniques to manage these symptoms Hydrate well Eat regular healthy meals Stay active Use relaxation techniques(deep breathing, meditating, yoga) Do Not substitute Alcohol to help with tapering If you have been on opioids for less than two weeks and do not have pain than it is ok to stop all together.  Plan to wean off of opioids This plan should start within one week post op of your joint replacement. Maintain the same interval or time between taking each dose and first decrease the dose.  Cut the total daily intake of opioids by one tablet each day Next start to increase the time between doses. The last dose that should be eliminated is the evening dose.   MAKE SURE YOU:   Understand these instructions.  Get help right away if you are not doing well or get worse.    Thank you for letting us be a part of your medical care team.  It is a privilege we respect greatly.  We hope these instructions will help you stay on track for a fast and full recovery!

## 2023-02-05 ENCOUNTER — Encounter (HOSPITAL_COMMUNITY): Payer: Self-pay | Admitting: Orthopedic Surgery

## 2023-02-05 NOTE — Anesthesia Postprocedure Evaluation (Signed)
Anesthesia Post Note  Patient: Tonya Farrell  Procedure(s) Performed: TOTAL KNEE ARTHROPLASTY (Left: Knee)     Patient location during evaluation: PACU Anesthesia Type: MAC, Spinal and Regional Level of consciousness: oriented and awake and alert Pain management: pain level controlled Vital Signs Assessment: post-procedure vital signs reviewed and stable Respiratory status: spontaneous breathing, respiratory function stable and patient connected to nasal cannula oxygen Cardiovascular status: blood pressure returned to baseline and stable Postop Assessment: no headache, no backache and no apparent nausea or vomiting Anesthetic complications: no   No notable events documented.  Last Vitals:  Vitals:   02/04/23 1100 02/04/23 1200  BP: 127/71 (!) 144/76  Pulse: 87 83  Resp:    Temp:    SpO2: 97% 98%    Last Pain:  Vitals:   02/04/23 1200  TempSrc:   PainSc: 1                  Mikaili Flippin S

## 2023-04-09 ENCOUNTER — Ambulatory Visit: Payer: No Typology Code available for payment source

## 2023-04-09 ENCOUNTER — Other Ambulatory Visit: Payer: No Typology Code available for payment source

## 2023-06-14 IMAGING — DX DG CHEST 2V
2 series · 2 of 2 positions shown · non-contrast
Comparison: None.

CLINICAL DATA: Fatigue

EXAM:
CHEST - 2 VIEW

[chest pa]
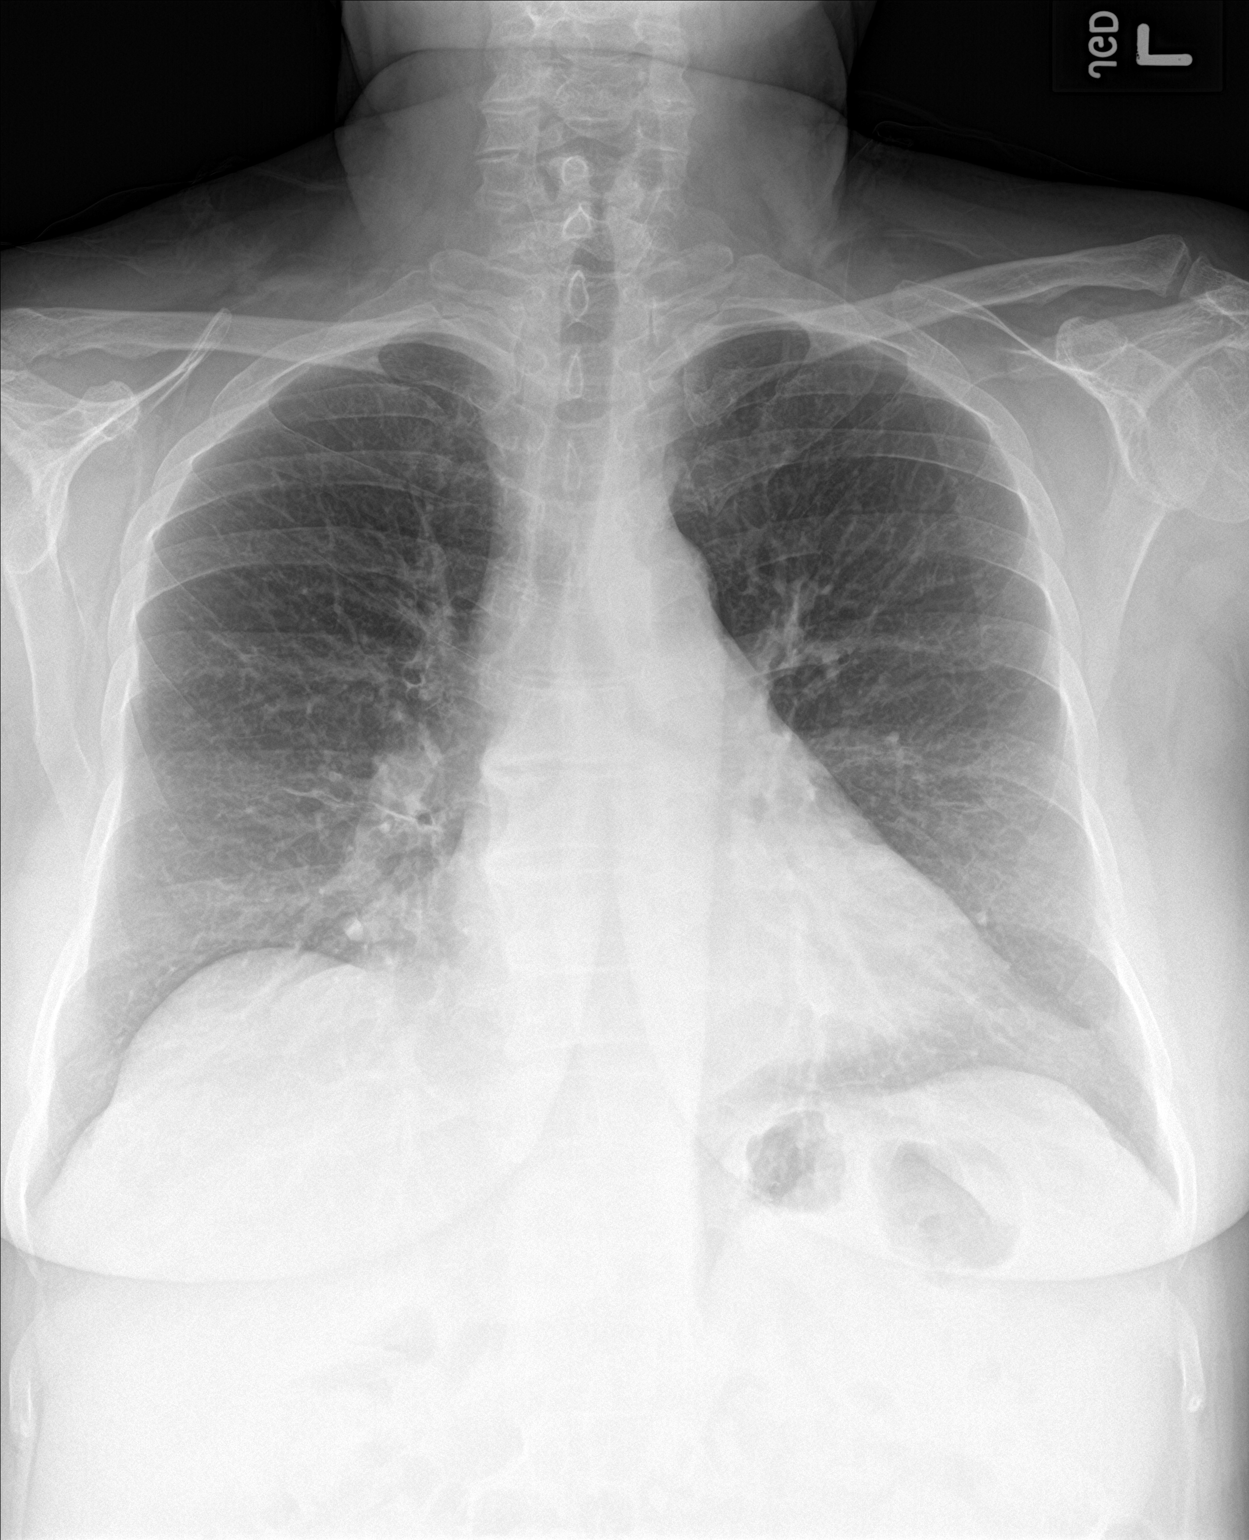

[chest lat]
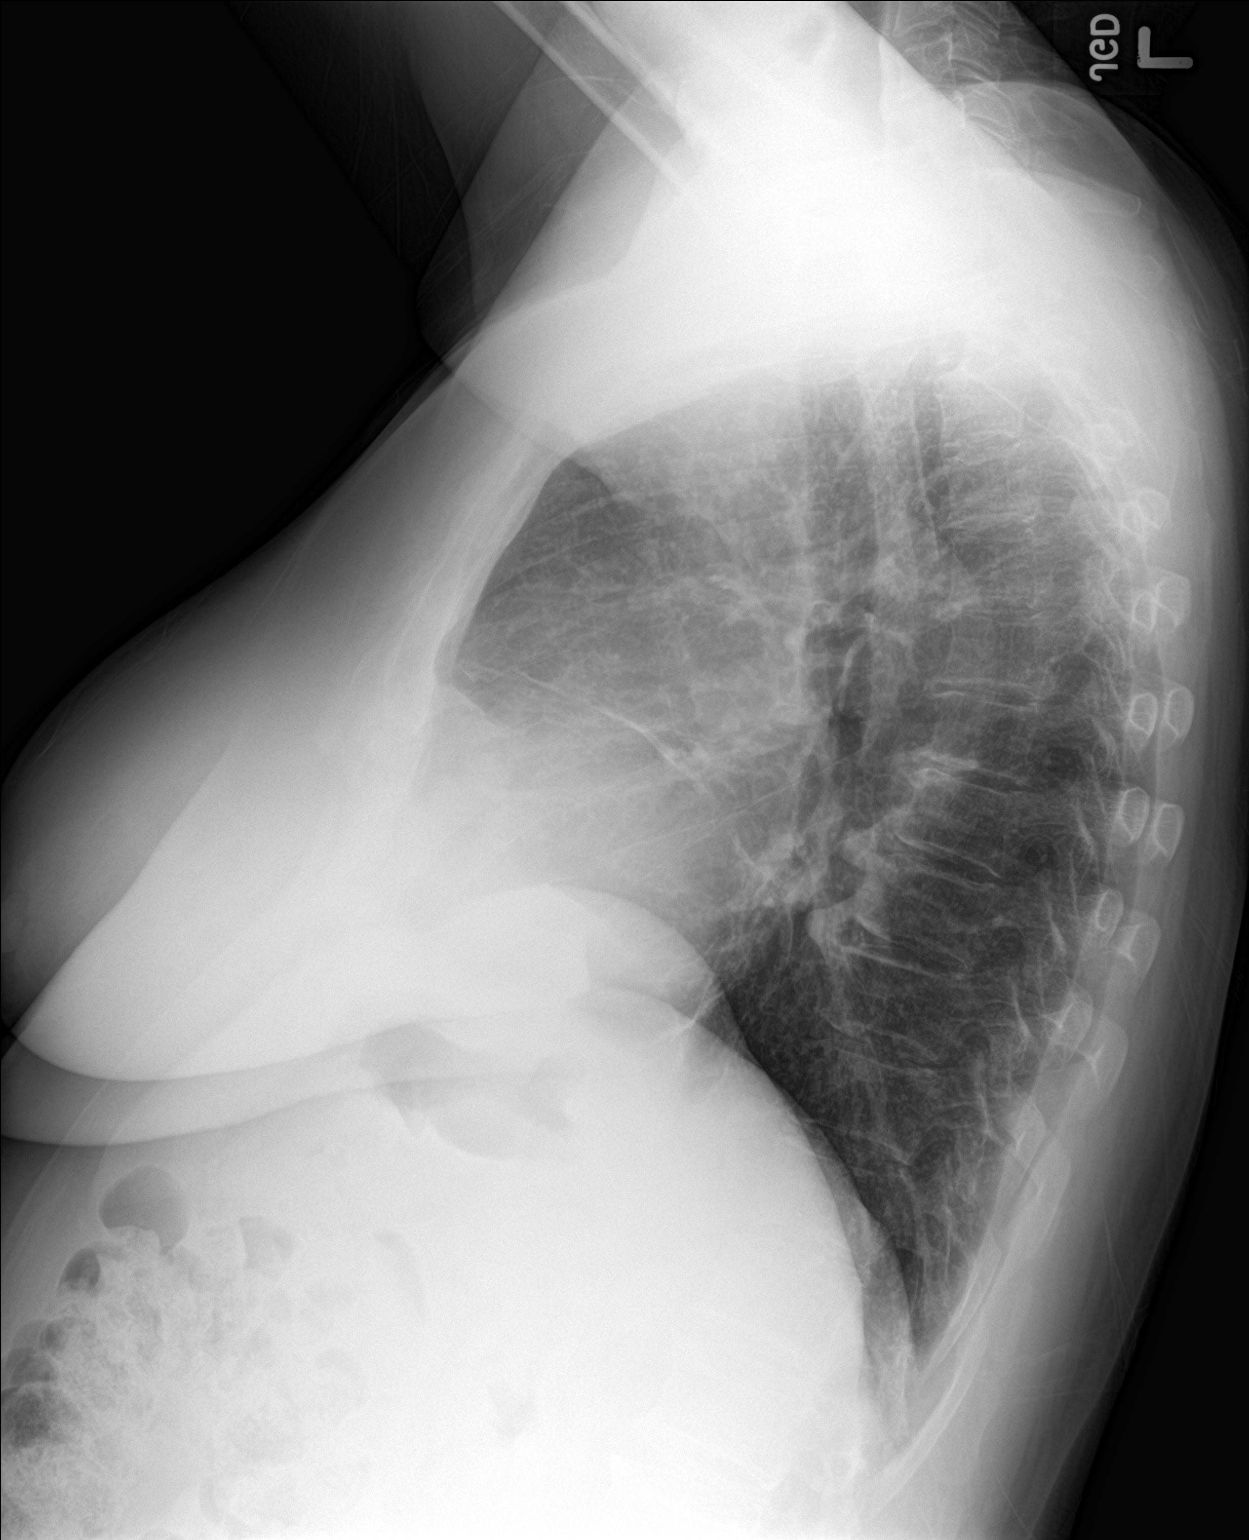

[2 of 2 positions shown; findings below may reference images not displayed]

FINDINGS: The heart size and mediastinal contours are within normal limits.
Mild right middle lobe atelectasis. Lungs are otherwise clear. The
visualized skeletal structures are unremarkable.
IMPRESSION: No active cardiopulmonary disease.

## 2023-09-22 ENCOUNTER — Encounter: Payer: Self-pay | Admitting: Registered Nurse

## 2023-09-22 ENCOUNTER — Ambulatory Visit: Admitting: Registered Nurse

## 2023-09-22 VITALS — BP 127/71 | HR 97 | Temp 99.3°F

## 2023-09-22 DIAGNOSIS — M79674 Pain in right toe(s): Secondary | ICD-10-CM

## 2023-09-22 DIAGNOSIS — L600 Ingrowing nail: Secondary | ICD-10-CM

## 2023-09-22 DIAGNOSIS — Z0000 Encounter for general adult medical examination without abnormal findings: Secondary | ICD-10-CM

## 2023-09-22 MED ORDER — CEPHALEXIN 500 MG PO CAPS: 500.0000 mg | ORAL_CAPSULE | Freq: Two times a day (BID) | ORAL | Status: AC

## 2023-09-22 NOTE — Patient Instructions (Signed)
 Ingrown Toenail  An ingrown toenail occurs when the corner or sides of a toenail grow into the surrounding skin. This causes discomfort and pain. The big toe is most commonly affected, but any of the toes can be affected. If an ingrown toenail is not treated, it can become infected. What are the causes? This condition may be caused by: Wearing shoes that are too small or tight. An injury, such as stubbing your toe or having your toe stepped on. Improper cutting or care of your toenails. Having nail or foot abnormalities that were present from birth (congenital abnormalities), such as having a nail that is too big for your toe. What increases the risk? The following factors may make you more likely to develop ingrown toenails: Age. Nails tend to get thicker with age, so ingrown nails are more common among older people. Cutting your toenails incorrectly, such as cutting them very short or cutting them unevenly. An ingrown toenail is more likely to get infected if you have: Diabetes. Blood flow (circulation) problems. What are the signs or symptoms? Symptoms of an ingrown toenail may include: Pain, soreness, or tenderness. Redness. Swelling. Hardening of the skin that surrounds the toenail. Signs that an ingrown toenail may be infected include: Fluid or pus. Symptoms that get worse. How is this diagnosed? Ingrown toenails may be diagnosed based on: Your symptoms and medical history. A physical exam. Labs or tests. If you have fluid or blood coming from your toenail, a sample may be collected to test for the specific type of bacteria that is causing the infection. How is this treated? Treatment depends on the severity of your symptoms. You may be able to care for your toenail at home. If you have an infection, you may be prescribed antibiotic medicines. If you have fluid or pus draining from your toenail, your health care provider may drain it. If you have trouble walking, you may be  given crutches to use. If you have a severe or infected ingrown toenail, you may need a procedure to remove part or all of the nail. Follow these instructions at home: Foot care  Check your wound every day for signs of infection, or as often as told by your health care provider. Check for: More redness, swelling, or pain. More fluid or blood. Warmth. Pus or a bad smell. Do not pick at your toenail or try to remove it yourself. Soak your foot in warm, soapy water. Do this for 20 minutes, 3 times a day, or as often as told by your health care provider. This helps to keep your toe clean and your skin soft. Wear shoes that fit well and are not too tight. Your health care provider may recommend that you wear open-toed shoes while you heal. Trim your toenails regularly and carefully. Cut your toenails straight across to prevent injury to the skin at the corners of the toenail. Do not cut your nails in a curved shape. Keep your feet clean and dry to help prevent infection. General instructions Take over-the-counter and prescription medicines only as told by your health care provider. If you were prescribed an antibiotic, take it as told by your health care provider. Do not stop taking the antibiotic even if you start to feel better. If your health care provider told you to use crutches to help you move around, use them as instructed. Return to your normal activities as told by your health care provider. Ask your health care provider what activities are safe for you.  Keep all follow-up visits. This is important. Contact a health care provider if: You have more redness, swelling, pain, or other symptoms that do not improve with treatment. You have fluid, blood, or pus coming from your toenail. You have a red streak on your skin that starts at your foot and spreads up your leg. You have a fever. Summary An ingrown toenail occurs when the corner or sides of a toenail grow into the surrounding skin.  This causes discomfort and pain. The big toe is most commonly affected, but any of the toes can be affected. If an ingrown toenail is not treated, it can become infected. Fluid or pus draining from your toenail is a sign of infection. Your health care provider may need to drain it. You may be given antibiotics to treat the infection. Trimming your toenails regularly and properly can help you prevent an ingrown toenail. This information is not intended to replace advice given to you by your health care provider. Make sure you discuss any questions you have with your health care provider. Document Revised: 09/25/2020 Document Reviewed: 09/25/2020 Elsevier Patient Education  2024 [employer].

## 2023-09-22 NOTE — Progress Notes (Signed)
 Subjective:    Patient ID: Tonya Farrell, female    DOB: 1960/07/14, 63 y.o.   MRN: [id number]  Tonya Farrell is a 63 year old established caucasian female who presents with right great toe pain.  She experiences significant pain when pressing on the side of her right great toe, although she can wear her shoes without difficulty at work. Last month, she cut her toenail and a large piece was digging into her skin. Initially, the condition improved but has since worsened.  She has been soaking her foot in Epsom salt and applying a topical antibiotic triple cream ointment daily. Despite these measures, the pain has persisted. No discharge, fever, or chills.      Review of Systems  Constitutional:  Negative for chills, diaphoresis and fever.  HENT:  Negative for trouble swallowing and voice change.   Eyes:  Negative for photophobia and visual disturbance.  Respiratory:  Negative for cough, shortness of breath, wheezing and stridor.   Gastrointestinal:  Negative for diarrhea, nausea and vomiting.  Genitourinary:  Negative for difficulty urinating.  Musculoskeletal:  Positive for myalgias. Negative for gait problem.  Skin:  Negative for color change, rash and wound.  Neurological:  Negative for tremors, syncope and weakness.  Hematological:  Does not bruise/bleed easily.  Psychiatric/Behavioral:  Negative for agitation, confusion and sleep disturbance.        Objective:   Physical Exam Vitals and nursing note reviewed.  Constitutional:      General: She is awake. She is not in acute distress.    Appearance: Normal appearance. She is well-developed, well-groomed and overweight. She is not ill-appearing, toxic-appearing or diaphoretic.  HENT:     Head: Normocephalic and atraumatic.     Jaw: There is normal jaw occlusion.     Salivary Glands: Right salivary gland is not diffusely enlarged. Left salivary gland is not diffusely enlarged.     Right Ear: Hearing and external ear  normal. No decreased hearing noted.     Left Ear: Hearing and external ear normal. No decreased hearing noted.     Nose: Nose normal. No congestion or rhinorrhea.     Mouth/Throat:     Lips: Pink. No lesions.     Mouth: Mucous membranes are moist. No oral lesions or angioedema.     Dentition: No gum lesions.     Pharynx: Oropharynx is clear.  Eyes:     General: Lids are normal. Vision grossly intact. Gaze aligned appropriately. Allergic shiner present. No scleral icterus.       Right eye: No discharge.        Left eye: No discharge.     Extraocular Movements: Extraocular movements intact.     Conjunctiva/sclera: Conjunctivae normal.     Pupils: Pupils are equal, round, and reactive to light.  Neck:     Trachea: Trachea and phonation normal.  Cardiovascular:     Rate and Rhythm: Normal rate and regular rhythm.     Pulses: Normal pulses.          Radial pulses are 2+ on the right side and 2+ on the left side.  Pulmonary:     Effort: Pulmonary effort is normal. No respiratory distress.     Breath sounds: Normal breath sounds and air entry. No stridor or transmitted upper airway sounds. No wheezing or rales.     Comments: Spoke full sentences without difficulty; no cough observed in exam room Abdominal:     General: Abdomen is flat.     Palpations:  Abdomen is soft.  Musculoskeletal:        General: Swelling and tenderness present. No deformity. Normal range of motion.     Right hand: Normal strength. Normal capillary refill.     Left hand: Normal strength. Normal capillary refill.     Cervical back: Normal range of motion and neck supple. No swelling, edema, deformity, erythema, signs of trauma, lacerations, rigidity, torticollis or crepitus. No pain with movement. Normal range of motion.     Right lower leg: No edema.     Left lower leg: No edema.     Right foot: Normal range of motion and normal capillary refill. Swelling and tenderness present. No deformity, bunion, Charcot foot,  foot drop, laceration, bony tenderness or crepitus.       Legs:     Comments: Medial distal right 1st toe at nail fold callous mildly TTP faint erythema; toenail clipped extremely short to where adherent to nail bed and sharp edges attempted to place cotton under medial edge but not sufficient nail files sharp ragged edges  Lymphadenopathy:     Head:     Right side of head: No submandibular or preauricular adenopathy.     Left side of head: No submandibular or preauricular adenopathy.     Cervical: No cervical adenopathy.     Right cervical: No superficial cervical adenopathy.    Left cervical: No superficial cervical adenopathy.  Skin:    General: Skin is warm and dry.     Capillary Refill: Capillary refill takes less than 2 seconds.     Coloration: Skin is not ashen, cyanotic, jaundiced, mottled, pale or sallow.     Findings: Rash present. No abrasion, abscess, acne, bruising, burn, ecchymosis, erythema, signs of injury, laceration, lesion, petechiae or wound. Rash is macular. Rash is not crusting, nodular, purpuric, pustular, scaling, urticarial or vesicular.     Nails: There is no clubbing.     Comments: Foul odor toes right foot no discharge/maceration; faint erythema localized and callous distal and adjacent to nail fold/distal nail border  Neurological:     General: No focal deficit present.     Mental Status: She is alert and oriented to person, place, and time. Mental status is at baseline.     GCS: GCS eye subscore is 4. GCS verbal subscore is 5. GCS motor subscore is 6.     Cranial Nerves: No cranial nerve deficit, dysarthria or facial asymmetry.     Motor: Motor function is intact. No weakness, tremor, atrophy, abnormal muscle tone or seizure activity.     Coordination: Coordination is intact. Coordination normal.     Gait: Gait is intact. Gait normal.     Comments: In/out of chair without difficulty; gait sure and steady in clinic; bilateral hand grasp equal 5/5  Psychiatric:         Attention and Perception: Attention and perception normal.        Mood and Affect: Mood and affect normal.        Speech: Speech normal.        Behavior: Behavior normal. Behavior is cooperative.        Thought Content: Thought content normal.        Cognition and Memory: Cognition and memory normal.        Judgment: Judgment normal.           Assessment & Plan:   A-ingrown toenail right great toe initial visit; preventative healthcare, pain of right great toe  P-Continue neosporin/triple antibiotic  topical BID application to toe and daily epsom salt soaks,  Reviewed insurance network provider podiatrists for [facility] with patient and patient preference out of 4 providers [facility].  Ambulatory referral sent via Epic and RN Thersia Flax notified to call office to verify if fax required also.   Avoid tight fitting shoes.  Allow for toe wiggle room to prevent pressure on nail.  Wear socks in closed toed shoes.  Start keflex 500mg  po BID x 7 days #30 RF0 dispensed from [facility] to patient.  Avoid trimming sides of nail at angle trim nails in straight line  May put cotton under nail edge to help relieve pressure change daily/prn soiling. Exitcare handout on ingrown toenail  Tylenol 1000mg  po q6h prn pain. Medications as directed. Call or return to clinic as needed if these symptoms worsen or fail to improve as anticipated. Seek same day re-evaluation if you notice red streaks up toe or having fever/chills.  Patient given podiatry contact information and to call them if no call received in the next 2 weeks to schedule.  Patient verbalized agreement and understanding of treatment plan and had no further questions at this time. P2: ROM, injury prevention      Patient Be Well 2026 labs fasting due prior to 07 Jan 2024.  Female executive panel and Hgba1c fasting ordered for patient.  She is to schedule with RN Thersia Flax for her employer insurance discount.  Patient last labs August with [facility]  Labs must be after  10 Mar 2023 for 09 Mar 2024 insurance discount year.  Patient verbalized understanding information/instructions and had no further questions at that time.

## 2023-09-22 NOTE — Progress Notes (Signed)
 C/o severe pain in great toe right foot. Very tender to touch.

## 2023-09-28 ENCOUNTER — Ambulatory Visit (INDEPENDENT_AMBULATORY_CARE_PROVIDER_SITE_OTHER): Admitting: Podiatry

## 2023-09-28 ENCOUNTER — Encounter: Payer: Self-pay | Admitting: Podiatry

## 2023-09-28 DIAGNOSIS — L600 Ingrowing nail: Secondary | ICD-10-CM | POA: Diagnosis not present

## 2023-09-28 MED ORDER — NEOMYCIN-POLYMYXIN-HC 3.5-10000-1 OT SUSP: OTIC | 0 refills | Status: AC

## 2023-09-28 NOTE — Patient Instructions (Signed)

## 2023-09-28 NOTE — Progress Notes (Signed)
  Subjective:  Patient ID: Tonya Farrell, female    DOB: 12-24-1960,  MRN: [id number]  Chief Complaint  Patient presents with   Toe Pain    Hallux right - lateral border, ingrown x few weeks, tried soaking epsom salt, work doc rx'd cephalexin  and using neosporin, diabetic - last A1c was "5.?"   New Patient (Initial Visit)    63 y.o. female presents with the above complaint. History confirmed with patient.  Partial translation provided in person with an interpreter from [place] to [place].  She has an Rx for cephalexin  and has been taking this  Objective:  Physical Exam: warm, good capillary refill, no trophic changes or ulcerative lesions, normal DP and PT pulses, and normal sensory exam.  Ingrown right hallux lateral border no active paronychia  Assessment:   1. Ingrowing right great toenail      Plan:  Patient was evaluated and treated and all questions answered.    Ingrown Nail, right -Patient elects to proceed with minor surgery to remove ingrown toenail today. Consent reviewed and signed by patient. -Ingrown nail excised. See procedure note. -Educated on post-procedure care including soaking. Written instructions provided and reviewed. -Rx for Cortisporin sent to pharmacy. -Advised on signs and symptoms of infection developing.  We discussed that the phenol likely will create some redness and edema and tenderness around the nailbed as long as it is localized this is to be expected.  Will return as needed if any infection signs develop  Procedure: Excision of Ingrown Toenail Location: Right 1st toe lateral nail borders. Anesthesia: Lidocaine 1% plain; 1.5 mL and Marcaine  0.5% plain; 1.5 mL, digital block. Skin Prep: Betadine . Dressing: Silvadene; telfa; dry, sterile, compression dressing. Technique: Following skin prep, the toe was exsanguinated and a tourniquet was secured at the base of the toe. The affected nail border was freed, split with a nail splitter, and  excised. Chemical matrixectomy was then performed with phenol and irrigated out with alcohol . The tourniquet was then removed and sterile dressing applied. Disposition: Patient tolerated procedure well.    Return if symptoms worsen or fail to improve.

## 2023-12-08 ENCOUNTER — Other Ambulatory Visit

## 2023-12-08 ENCOUNTER — Other Ambulatory Visit: Payer: Self-pay | Admitting: Registered Nurse

## 2023-12-08 NOTE — Progress Notes (Unsigned)
 EE in clinic this morning for Be Well labs.  Blood drawn and EE tolerated procedure well.  Apolinar Sharps, RN, MPH, BSN, COHN-S

## 2023-12-09 LAB — SPECIMEN STATUS

## 2023-12-11 LAB — SPECIMEN STATUS REPORT

## 2023-12-11 LAB — CMP12+LP+TP+TSH+6AC+CBC/D/PLT

## 2023-12-13 ENCOUNTER — Ambulatory Visit: Payer: Self-pay | Admitting: Registered Nurse

## 2023-12-13 DIAGNOSIS — R7989 Other specified abnormal findings of blood chemistry: Secondary | ICD-10-CM

## 2023-12-13 DIAGNOSIS — E119 Type 2 diabetes mellitus without complications: Secondary | ICD-10-CM

## 2023-12-15 LAB — CMP12+LP+TP+TSH+6AC+CBC/D/PLT
ALT: 107 IU/L
AST: 81 IU/L — AB (ref 0–40)
Albumin: 4.4 g/dL (ref 3.9–4.9)
Alkaline Phosphatase: 129 IU/L — AB (ref 44–121)
BUN/Creatinine Ratio: 29
BUN: 20 mg/dL
Bilirubin Total: <0.2 mg/dL (ref 0.0–1.2)
Calcium: 9.4 mg/dL
Chloride: 103 mmol/L (ref 96–106)
Cholesterol, Total: 150 mg/dL
Creatinine, Ser: 0.69 mg/dL
GGT: 138 IU/L
Globulin, Total: 2.6 g/dL (ref 1.5–4.5)
Glucose: 150 mg/dL — AB (ref 70–99)
HDL: 40 mg/dL (ref >39)
Iron: 67 ug/dL
LDL CALC COMMENT:: 3.8 ratio
LDL Chol Calc (NIH): 83 mg/dL
Phosphorus: 3.4 mg/dL
Potassium: 4.5 mmol/L (ref 3.5–5.2)
Sodium: 140 mmol/L (ref 134–144)
T3 Uptake Ratio: 2.8 (ref 1.2–4.9)
T4, Total: 26
TSH: 10.6 ug/dL (ref 4.5–12.0)
TSH: 4.52 u[IU]/mL — AB (ref 0.450–4.500)
Total Protein: 7 g/dL (ref 6.0–8.5)
Triglycerides: 153 mg/dL
Uric Acid: 4.6 mg/dL
VLDL Cholesterol Cal: 27 mg/dL (ref 5–40)

## 2023-12-15 LAB — CMP12+LP+TP+TSH+6AC+PSA+CBC…
ALT: 109 IU/L
AST: 83 IU/L — ABNORMAL HIGH (ref 0–40)
Albumin: 4.2 g/dL (ref 3.9–4.9)
Alkaline Phosphatase: 130 IU/L — ABNORMAL HIGH (ref 44–121)
BUN/Creatinine Ratio: 29
BUN: 19 mg/dL
Bilirubin Total: <0.2 mg/dL (ref 0.0–1.2)
Calcium: 9.5 mg/dL
Chloride: 102 mmol/L (ref 96–106)
Chol/HDL Ratio: 3.8 ratio
Cholesterol, Total: 150 mg/dL
Creatinine, Ser: 0.65 mg/dL
Free Thyroxine Index: 2.6 (ref 1.2–4.9)
GGT: 137 IU/L
Globulin, Total: 2.7 g/dL (ref 1.5–4.5)
Glucose: 145 mg/dL — ABNORMAL HIGH (ref 70–99)
HDL: 40 mg/dL (ref >39)
Iron: 65 ug/dL
LDL Chol Calc (NIH): 84 mg/dL
Phosphorus: 3.2 mg/dL
Potassium: 4.5 mmol/L (ref 3.5–5.2)
Prostate Specific Ag, Serum: <0.1 ng/mL
Sodium: 140 mmol/L (ref 134–144)
T3 Uptake Ratio: 26 %
T4, Total: 10 ug/dL (ref 4.5–12.0)
TSH: 4.32 u[IU]/mL (ref 0.450–4.500)
Total Protein: 6.9 g/dL (ref 6.0–8.5)
Triglycerides: 148 mg/dL
Uric Acid: 4.4 mg/dL
VLDL Cholesterol Cal: 26 mg/dL (ref 5–40)

## 2023-12-15 LAB — SPECIMEN STATUS REPORT

## 2023-12-15 LAB — HGB A1C W/O EAG: Hgb A1c MFr Bld: 6.8 % — ABNORMAL HIGH (ref 4.8–5.6)

## 2023-12-17 ENCOUNTER — Ambulatory Visit: Payer: Self-pay | Admitting: Registered Nurse

## 2023-12-17 ENCOUNTER — Encounter: Payer: Self-pay | Admitting: Registered Nurse

## 2023-12-17 ENCOUNTER — Other Ambulatory Visit: Payer: Self-pay | Admitting: Registered Nurse

## 2023-12-17 ENCOUNTER — Ambulatory Visit: Admitting: Registered Nurse

## 2023-12-17 VITALS — Resp 16 | Ht 63.0 in

## 2023-12-17 DIAGNOSIS — Z0000 Encounter for general adult medical examination without abnormal findings: Secondary | ICD-10-CM

## 2023-12-17 DIAGNOSIS — M25461 Effusion, right knee: Secondary | ICD-10-CM

## 2023-12-17 DIAGNOSIS — R7989 Other specified abnormal findings of blood chemistry: Secondary | ICD-10-CM

## 2023-12-17 NOTE — Patient Instructions (Addendum)
 Hypothyroidism  Hypothyroidism is when the thyroid  gland does not make enough of certain hormones. This is called an underactive thyroid . The thyroid  gland is a small gland located in the lower front part of the neck, just in front of the windpipe (trachea). This gland makes hormones that help control how the body uses food for energy (metabolism) as well as how the heart and brain function. These hormones also play a role in keeping your bones strong. When the thyroid  is underactive, it produces too little of the hormones thyroxine (T4) and triiodothyronine (T3). What are the causes? This condition may be caused by: Hashimoto's disease. This is a disease in which the body's disease-fighting system (immune system) attacks the thyroid  gland. This is the most common cause. Viral infections. Pregnancy. Certain medicines. Birth defects. Problems with a gland in the center of the brain (pituitary gland). Lack of enough iodine  in the diet. Other causes may include: Past radiation treatments to the head or neck for cancer. Past treatment with radioactive iodine . Past exposure to radiation in the environment. Past surgical removal of part or all of the thyroid . What increases the risk? You are more likely to develop this condition if: You are female. You have a family history of thyroid  conditions. You use a medicine called lithium. You take medicines that affect the immune system (immunosuppressants). What are the signs or symptoms? Common symptoms of this condition include: Not being able to tolerate cold. Feeling as though you have no energy (lethargy). Lack of appetite. Constipation. Sadness or depression. Weight gain that is not explained by a change in diet or exercise habits. Menstrual irregularity. Dry skin, coarse hair, or brittle nails. Other symptoms may include: Muscle pain. Slowing of thought processes. Poor memory. How is this diagnosed? This condition may be diagnosed  based on: Your symptoms, your medical history, and a physical exam. Blood tests. You may also have imaging tests, such as an ultrasound or MRI. How is this treated? This condition is treated with medicine that replaces the thyroid  hormones that your body does not make. After you begin treatment, it may take several weeks for symptoms to go away. Follow these instructions at home: Take over-the-counter and prescription medicines only as told by your health care provider. If you start taking any new medicines, tell your health care provider. Keep all follow-up visits as told by your health care provider. This is important. As your condition improves, your dosage of thyroid  hormone medicine may change. You will need to have blood tests regularly so that your health care provider can monitor your condition. Contact a health care provider if: Your symptoms do not get better with treatment. You are taking thyroid  hormone replacement medicine and you: Sweat a lot. Have tremors. Feel anxious. Lose weight rapidly. Cannot tolerate heat. Have emotional swings. Have diarrhea. Feel weak. Get help right away if: You have chest pain. You have an irregular heartbeat. You have a rapid heartbeat. You have difficulty breathing. These symptoms may be an emergency. Get help right away. Call 911. Do not wait to see if the symptoms will go away. Do not drive yourself to the hospital. Summary Hypothyroidism is when the thyroid  gland does not make enough of certain hormones (it is underactive). When the thyroid  is underactive, it produces too little of the hormones thyroxine (T4) and triiodothyronine (T3). The most common cause is Hashimoto's disease, a disease in which the body's disease-fighting system (immune system) attacks the thyroid  gland. The condition can also be caused  by viral infections, medicine, pregnancy, or past radiation treatment to the head or neck. Symptoms may include weight gain, dry  skin, constipation, feeling as though you do not have energy, and not being able to tolerate cold. This condition is treated with medicine to replace the thyroid  hormones that your body does not make. This information is not intended to replace advice given to you by your health care provider. Make sure you discuss any questions you have with your health care provider. Document Revised: 05/28/2021 Document Reviewed: 05/28/2021 Elsevier Patient Education  2024 [employer]Baker Cyst  A Baker cyst, also called a popliteal cyst, is a growth that forms at the back of the knee. The cyst forms when the fluid-filled sac (bursa) behind the knee joint fills up with more fluid and becomes enlarged. What are the causes? In most cases, a Baker cyst results from another knee problem that causes swelling in the knee. This makes the fluid inside the knee joint (synovial fluid) flow into the bursa behind the knee, causing the bursa to enlarge. The fluid flows in one direction and is unable to move back into the joint. What increases the risk? You may be more likely to develop a Baker cyst if you already have a knee problem, such as: A tear in cartilage that cushions the knee joint (meniscal tear). A tear in the tissues that connect the bones of the knee joint (ligament tear). Knee swelling from osteoarthritis, rheumatoid arthritis, or gout. What are the signs or symptoms? The main symptom of this condition is a lump behind the knee. This may be the only symptom of the condition. The lump may be painful, especially when the knee is straightened. If the lump is painful, the pain may come and go. The knee may also be stiff. Symptoms may quickly get more severe if the cyst breaks open. If the cyst breaks open, you may feel the following in your knee and calf: Sudden or worsening pain. Swelling. Bruising. Redness in the calf. A Baker cyst does not always cause symptoms. How is this diagnosed? This condition may  be diagnosed based on your symptoms and medical history. Your health care provider will also do a physical exam. This may include: Feeling the cyst to check whether it is tender. Checking your knee for signs of another knee condition that causes swelling. You may have imaging tests, such as X-ray, ultrasound, or MRI. How is this treated? A Baker cyst that is not painful may go away without treatment. If the cyst gets large or painful, it will likely get better if the underlying knee problem is treated. If needed, treatment for a Baker cyst may include: Resting. Keeping weight off the knee. This means not leaning on the knee to support your body weight. Taking NSAIDs, such as ibuprofen, to reduce pain and swelling. Draining the fluid from the cyst with a needle (aspiration). Getting a steroid injection into the knee. A steroid reduces swelling. Surgery. This may be needed if other treatments do not work. This usually involves correcting knee damage and removing the cyst. In some cases, you may also need to do certain exercises to improve movement in the knee (physical therapy). Follow these instructions at home: Activity Rest as told by your provider. Avoid activities that make pain or swelling worse. Do not use the injured limb to support your body weight until your provider says that you can. Use crutches as told by your provider. Raise (elevate) your knee above the level of  your heart while you are sitting or lying down. Do physical therapy exercises as told by your provider. Return to your normal activities as told by your provider. Ask your provider what activities are safe for you. General instructions Take over-the-counter and prescription medicines only as told by your provider. Keep all follow-up visits. Your provider will monitor your healing and adjust your treatment as needed. Contact a health care provider if: You have knee pain, stiffness, or swelling that does not get  better. Get help right away if: You have sudden or worsening pain and swelling in your calf area. This information is not intended to replace advice given to you by your health care provider. Make sure you discuss any questions you have with your health care provider. Document Revised: 01/22/2022 Document Reviewed: 01/22/2022 Elsevier Patient Education  2024 [employer].

## 2023-12-17 NOTE — Progress Notes (Signed)
 Established Patient Office Visit  Subjective   Patient ID: Tonya Farrell, female    DOB: [birth year]  Age: 63 y.o. MRN: [id number]  Chief Complaint  Patient presents with   knee swelling right    Back of knee started after taking long walk earlier this week; denied pain/locking/giving out/trauma    63y/o caucasian female established patient here for evaluation swelling posterior right knee started earlier this week after taking long walk   Denied trauma/falls/previous injury/rash/bruising/locking/giving out or pain.  Was putting on clothes and noticed lump nontender.      Review of Systems  Constitutional:  Negative for chills and fever.  Cardiovascular:  Negative for leg swelling.  Musculoskeletal:  Negative for falls, joint pain and myalgias.  Skin:  Negative for itching and rash.  Neurological:  Negative for tingling, tremors, sensory change and weakness.  Psychiatric/Behavioral:  The patient does not have insomnia.       Objective:     Resp 16   Ht 5' 3 (1.6 m)   BMI 32.24 kg/m    Physical Exam Vitals and nursing note reviewed.  Constitutional:      General: She is awake. She is not in acute distress.    Appearance: Normal appearance. She is well-developed and well-groomed. She is not ill-appearing, toxic-appearing or diaphoretic.  HENT:     Head: Normocephalic and atraumatic.     Jaw: There is normal jaw occlusion.     Salivary Glands: Right salivary gland is not diffusely enlarged. Left salivary gland is not diffusely enlarged.     Right Ear: Hearing and external ear normal. No decreased hearing noted.     Left Ear: Hearing and external ear normal. No decreased hearing noted.     Nose: Nose normal. No congestion or rhinorrhea.     Mouth/Throat:     Lips: Pink. No lesions.     Mouth: Mucous membranes are moist.     Dentition: No gum lesions.     Tongue: No lesions. Tongue does not deviate from midline.     Palate: No mass and lesions.     Pharynx:  Oropharynx is clear. Uvula midline.  Eyes:     General: Lids are normal. Vision grossly intact. Gaze aligned appropriately. Allergic shiner present. No scleral icterus.       Right eye: No discharge.        Left eye: No discharge.     Extraocular Movements: Extraocular movements intact.     Conjunctiva/sclera: Conjunctivae normal.     Pupils: Pupils are equal, round, and reactive to light.  Neck:     Trachea: Trachea and phonation normal.  Cardiovascular:     Rate and Rhythm: Normal rate and regular rhythm.     Pulses:          Radial pulses are 2+ on the right side and 2+ on the left side.  Pulmonary:     Effort: Pulmonary effort is normal. No respiratory distress.     Breath sounds: Normal breath sounds and air entry. No stridor or transmitted upper airway sounds. No wheezing.     Comments: Spoke full sentences without difficulty; no cough observed in exam room Abdominal:     General: Abdomen is flat.     Palpations: Abdomen is soft.  Musculoskeletal:        General: No tenderness or signs of injury. Normal range of motion.     Right hand: Normal strength. Normal capillary refill.     Left hand: Normal  strength. Normal capillary refill.     Cervical back: Normal range of motion and neck supple. No edema, erythema, signs of trauma, rigidity, torticollis or crepitus. No pain with movement. Normal range of motion.     Right upper leg: No swelling, edema, deformity, lacerations or tenderness.     Left upper leg: No swelling, edema, deformity, lacerations or tenderness.     Right knee: Swelling present. No deformity, effusion, erythema, ecchymosis, lacerations or crepitus. Normal range of motion. No tenderness. Normal alignment.     Left knee: No swelling, deformity, effusion, erythema, ecchymosis, lacerations or crepitus. Normal range of motion. No tenderness. Normal alignment.     Right lower leg: No swelling, deformity, lacerations or tenderness. No edema.     Left lower leg: No  swelling, deformity, lacerations or tenderness. No edema.     Right ankle: No swelling, deformity, ecchymosis or lacerations. No tenderness. Normal range of motion.     Left ankle: No swelling, deformity, ecchymosis or lacerations. No tenderness. Normal range of motion.     Comments: Right posterior fossa localized edema centrally nummular nontender no increased temperature/rash/bruising; gait sure and steady in clinic; arom equal bilaterally  Lymphadenopathy:     Head:     Right side of head: No submandibular or preauricular adenopathy.     Left side of head: No submandibular or preauricular adenopathy.     Cervical:     Right cervical: No superficial cervical adenopathy.    Left cervical: No superficial cervical adenopathy.  Skin:    General: Skin is warm and dry.     Capillary Refill: Capillary refill takes less than 2 seconds.     Coloration: Skin is not ashen, cyanotic, jaundiced, mottled, pale or sallow.     Findings: No abrasion, abscess, acne, bruising, burn, ecchymosis, erythema, signs of injury, laceration, lesion, petechiae, rash or wound.     Nails: There is no clubbing.  Neurological:     General: No focal deficit present.     Mental Status: She is alert and oriented to person, place, and time. Mental status is at baseline.     GCS: GCS eye subscore is 4. GCS verbal subscore is 5. GCS motor subscore is 6.     Cranial Nerves: No cranial nerve deficit, dysarthria or facial asymmetry.     Sensory: No sensory deficit.     Motor: Motor function is intact. No weakness, tremor, abnormal muscle tone or seizure activity.     Coordination: Coordination is intact. Coordination normal.     Gait: Gait is intact. Gait normal.     Comments: In/out of chair without difficulty; gait sure and steady in clinic; bilateral hand grasp equal 5/5  Psychiatric:        Attention and Perception: Attention and perception normal.        Mood and Affect: Mood and affect normal.        Speech: Speech  normal.        Behavior: Behavior normal. Behavior is cooperative.        Thought Content: Thought content normal.        Cognition and Memory: Cognition and memory normal.        Judgment: Judgment normal.      No results found for any visits on 12/17/23.    The 10-year ASCVD risk score (Arnett DK, et al., 2019) is: 14.4%     Latest Reference Range & Units 01/23/23 09:36 12/08/23 00:00  Comprehensive metabolic panel with GFR  Rpt !   Sodium 134 - 144 mmol/L 134 - 144 mmol/L 137 140 140  Potassium 3.5 - 5.2 mmol/L 3.5 - 5.2 mmol/L 4.3 4.5 4.5  Chloride 96 - 106 mmol/L 96 - 106 mmol/L 104 103 102  CO2 22 - 32 mmol/L 25   Glucose 70 - 99 mg/dL 70 - 99 mg/dL 862 (H) 849 (H) 854 (H)  BUN mg/dL mg/dL 23 20 19   Creatinine mg/dL mg/dL 9.39 9.30 9.34  Calcium  mg/dL mg/dL 9.0 9.4 9.5  Anion gap 5 - 15  8   BUN/Creatinine Ratio   29 29  eGFR mL/min/1.73 mL/min/1.73  CANCELED CANCELED  Phosphorus mg/dL mg/dL  3.4 3.2  Alkaline Phosphatase 44 - 121 IU/L 44 - 121 IU/L 77 129 (H) 130 (H)  Albumin 3.9 - 4.9 g/dL 3.9 - 4.9 g/dL 4.1 4.4 4.2  Uric Acid mg/dL mg/dL  4.6 4.4  AST 0 - 40 IU/L 0 - 40 IU/L 60 (H) 81 (H) 83 (H)  ALT IU/L IU/L 108 (H) 107 109  Total Protein 6.0 - 8.5 g/dL 6.0 - 8.5 g/dL 7.2 7.0 6.9  Total Bilirubin 0.0 - 1.2 mg/dL 0.0 - 1.2 mg/dL 0.5 <9.7 <9.7  GGT IU/L IU/L  138 137  GFR, Estimated >60 mL/min >60   Estimated CHD Risk times avg. times avg.  Comment Comment  LDH IU/L IU/L  CANCELED CANCELED  Total CHOL/HDL Ratio ratio ratio  3.8 3.8  Cholesterol, Total mg/dL mg/dL  849 849  HDL Cholesterol >39 mg/dL >60 mg/dL  40 40  Triglycerides mg/dL mg/dL  846 851  VLDL Cholesterol Cal 5 - 40 mg/dL 5 - 40 mg/dL  27 26  LDL Chol Calc (NIH) mg/dL mg/dL  83 84  Iron ug/dL ug/dL  67 65  Globulin, Total 1.5 - 4.5 g/dL 1.5 - 4.5 g/dL  2.6 2.7  WBC [id number] x10E3/uL -  5.8 CANCELED CANCELED WILL FOLLOW (P)  RBC  x10E6/uL x10E6/uL -  4.41 CANCELED CANCELED WILL FOLLOW (P)  Hemoglobin g/dL g/dL -  86.5 CANCELED CANCELED WILL FOLLOW (P)  HCT % % -  40.2 CANCELED CANCELED WILL FOLLOW (P)  MCV fL fL -  91.2 CANCELED CANCELED WILL FOLLOW (P)  MCH pg pg -  30.4 CANCELED CANCELED WILL FOLLOW (P)  MCHC g/dL g/dL -  66.6 CANCELED CANCELED WILL FOLLOW (P)  RDW % % -  12.5 CANCELED CANCELED WILL FOLLOW (P)  Platelets x10E3/uL x10E3/uL -  215 CANCELED CANCELED WILL FOLLOW (P)  nRBC % % -  0.0 CANCELED CANCELED WILL FOLLOW (P)  Hematology Comments:   CANCELED CANCELED WILL FOLLOW (P)  Neutrophils % % -  55 CANCELED CANCELED WILL FOLLOW (P)  Lymphocytes % 29   Monocytes Relative % 11   Eosinophil % 4   Basophil % 1   Immature Granulocytes % % -  0 CANCELED CANCELED WILL FOLLOW (P)  NEUT# x10E3/uL x10E3/uL -  3.2 CANCELED CANCELED WILL FOLLOW (P)  Lymphs Abs x10E3/uL x10E3/uL -  1.7 CANCELED CANCELED WILL FOLLOW (P)  Monocyte # 0.1 - 1.0 K/uL 0.7   Monocytes Absolute x10E3/uL x10E3/uL -   CANCELED CANCELED WILL FOLLOW (P)  Eosinophils Absolute 0.0 - 0.5 K/uL 0.2   Basophils Absolute x10E3/uL x10E3/uL -  0.0 CANCELED CANCELED WILL FOLLOW (P)  Abs Immature Granulocytes 0.00 - 0.07 K/uL 0.02   Immature Grans (Abs) x10E3/uL x10E3/uL -   CANCELED CANCELED WILL FOLLOW (P)  Immature Cells   CANCELED CANCELED WILL FOLLOW (P)  Lymphs % % -  CANCELED CANCELED WILL FOLLOW (P)  Monocytes % % -   CANCELED CANCELED WILL FOLLOW (P)  Basos % % -   CANCELED CANCELED WILL FOLLOW (P)  Eos % % -   CANCELED CANCELED WILL FOLLOW (P)  EOS (ABSOLUTE) x10E3/uL x10E3/uL -   CANCELED CANCELED WILL FOLLOW (P)  Hemoglobin A1C 4.8 - 5.6 %  6.8 (H)  TSH 0.450 - 4.500 uIU/mL 0.450 - 4.500 uIU/mL  4.520 (H) 4.320  Thyroxine (T4) 4.5 - 12.0 ug/dL 4.5 - 87.9 ug/dL  89.3 89.9  Free Thyroxine Index 1.2 - 4.9  1.2 - 4.9   2.8 2.6  T3 Uptake Ratio  % %  26 26  !: Data is abnormal (H): Data is abnormally high (P): Preliminary Rpt: View report in Results Review for more information Assessment & Plan:   Problem List Items Addressed This Visit   None Visit Diagnoses       Swelling of joint of right knee    -  Primary     Elevated TSH         Discussed Be Well results with patient in detail see results note. Results/instructions printed and given to patient in clinic today. Compared to previous Be Well results.  Discussed activity after meals/snacks and avoiding dehydration and keeping added sugars less than 25 grams per day.  Patient is taking her metformin daily. Will repeat TSH in 3 months nonfasting.  Patient to schedule with clinic staff.  Denied symptoms hypothyroidism.  Exitcare handout hypothyroidism given to patient printed.  Keep routine appts with PCM.  Met requirements for insurance discount and [employer] form given to [employer] Jen for discount to start 09 Mar 2024.  Discussed with patient most likely Baker's Cyst.  Discussed symptoms of DVT/blood clot with patient e.g. swelling, erythema, new/worsening pain.  May apply ice QID 15 minutes prn swelling. Discussed may use tylenol  1000mg  po q6h prn pain  May wrap with ace bandage or neoprene compression sleeve if needed for comfort.  Discussed avoid prolonged walking until swelling improved.  Elevate legs when sitting especially after work.   If new pain or worsening swelling/new symptoms notify clinic staff/PCM and seek re-evaluation.  Discussed if worsening will consider imaging US /xray and referral to orthopedics.  Exitcare handout on Baker's cyst printed and given to patient.  Patient agreed with plan of care and had no further questions at this time.  Return in about 3 months (around 03/18/2024), or if symptoms worsen or fail to improve, for recheck tsh.    Ellouise DELENA Hope, NP

## 2024-01-18 DIAGNOSIS — Z860100 Personal history of colon polyps, unspecified: Secondary | ICD-10-CM | POA: Insufficient documentation

## 2024-03-21 DIAGNOSIS — M25571 Pain in right ankle and joints of right foot: Secondary | ICD-10-CM | POA: Insufficient documentation

## 2024-03-21 DIAGNOSIS — K760 Fatty (change of) liver, not elsewhere classified: Secondary | ICD-10-CM | POA: Insufficient documentation

## 2024-03-24 ENCOUNTER — Telehealth: Payer: Self-pay | Admitting: Registered Nurse

## 2024-03-24 ENCOUNTER — Encounter: Payer: Self-pay | Admitting: Registered Nurse

## 2024-03-24 DIAGNOSIS — M79671 Pain in right foot: Secondary | ICD-10-CM

## 2024-03-24 NOTE — Telephone Encounter (Signed)
 Patient reported by end of shift right midfoot pain. Sometimes it is swollen. Has tried icy hot, compression and diclofenac  gel but not resolving problem. Discussed with patient try heat if feeling stiff; ice 15 minutes QID prn pain/swelling and elevate feet when she gets home from work.  Continue to wear shoes with cushioned sole, compression socks at work.  Schedule appt with me next Tuesday if no improvement with plan of care as clinic closed for today through Sunday and next NP clinic onsite Tuesday.  Patient denied rash/fever/bruising/injury known.  Patient agreed with plan of care and had no further questions at this time.

## 2024-03-29 ENCOUNTER — Ambulatory Visit: Admitting: Registered Nurse

## 2024-03-29 ENCOUNTER — Encounter: Payer: Self-pay | Admitting: Registered Nurse

## 2024-03-29 VITALS — BP 144/84 | HR 84 | Temp 98.3°F | Resp 16

## 2024-03-29 DIAGNOSIS — M79671 Pain in right foot: Secondary | ICD-10-CM

## 2024-03-29 DIAGNOSIS — R030 Elevated blood-pressure reading, without diagnosis of hypertension: Secondary | ICD-10-CM

## 2024-03-29 MED ORDER — PREDNISONE 10 MG PO TABS: ORAL_TABLET | ORAL | Status: AC

## 2024-03-29 MED ORDER — DICLOFENAC SODIUM 75 MG PO TBEC: 75.0000 mg | DELAYED_RELEASE_TABLET | Freq: Two times a day (BID) | ORAL | 0 refills | Status: DC

## 2024-03-29 NOTE — Patient Instructions (Signed)
 Foot Pain Many things can cause foot pain. Common causes include injuries to the foot. The injuries include sprains or broken bones, or injuries that affect the nerves in the feet. Other causes of foot pain include arthritis, blisters, and bunions. To know what causes your foot pain, your health care provider will take a detailed history of your symptoms. They will also do a physical exam as well as imaging tests, such as X-ray or MRI. Follow these instructions at home: Managing pain, stiffness, and swelling  If told, put ice on the painful area. Put ice in a plastic bag. Place a towel between your skin and the bag. Leave the ice on for 20 minutes, 2-3 times a day. If your skin turns bright red, remove the ice right away to prevent skin damage. The risk of damage is higher if you cannot feel pain, heat, or cold. Activity Do not stand or walk for long periods. Do stretches to relieve foot pain and stiffness as told by your provider. Do not lift anything that is heavier than 10 lb (4.5 kg), or the limit that you are told, until your provider says that it is safe. Lifting a lot of weight can put added pressure on your feet. Return to your normal activities as told by your provider. Ask your provider what activities are safe for you. Lifestyle Wear comfortable, supportive shoes that fit you well. Do not wear high heels. Keep your feet clean and dry. General instructions Take over-the-counter and prescription medicines only as told by your provider. Rub your foot gently. Pay attention to any changes in your symptoms. Let your provider know if symptoms become worse. Keep all follow-up visits. Your provider will want to monitor your progress. Contact a health care provider if: Your pain does not get better after a few days of treatment at home. Your pain gets worse. You cannot stand on your foot. Your foot or toes are swollen. Your foot is numb or tingling. Get help right away if: Your foot  or toes turn white or blue. You have warmth and redness along your foot. This information is not intended to replace advice given to you by your health care provider. Make sure you discuss any questions you have with your health care provider. Document Revised: 06/19/2022 Document Reviewed: 02/25/2022 Elsevier Patient Education  2024 [employer]Arthritis: What to Know Arthritis is a term for joint pain. It can also mean joint disease. There are more than 100 types of arthritis. What are the causes? Wear and tear of a joint. This is the most common cause. Gout. This is when you get too much of a chemical called uric acid in your blood. It can build up and cause pain. Pain and swelling in a joint. Infection of a joint. Injuries in the joint. A reaction to medicines. In some cases, the cause may not be known. What are the signs or symptoms? Pain in a joint when moving. This is the main symptom. Redness, swelling, or stiffness at a joint. Warmth coming from a joint. A fever. A feeling of being sick. How is this diagnosed? You may be diagnosed based on an exam and tests. These tests may include: Blood tests. Pee tests. Imaging tests, such as: X-rays. An MRI. A CT scan. A biopsy. This is when fluid is removed from a joint for testing. How is this treated? To treat arthritis, you may need to: Treat the cause, if you know what it is. Rest. Raise the joint. Put cold  or hot packs on the joint. Take medicines to: Treat symptoms. Reduce pain and swelling. Have shots of a medicine, such as cortisone, put into the joint. You may also be told to make changes in your life. You may be told to: Do exercises. Lose weight. Follow these instructions at home: Medicines Take your medicines only as told. Do not take aspirin if your health care provider says that you may have gout. Activity Rest your joint as told. Avoid doing things that make your pain worse. Exercise as told. You may  need to do range-of-motion exercises or try: Swimming. Water  aerobics. Biking. Walking. Managing pain, stiffness, and swelling     Use ice or an ice pack as told. Place a towel between your skin and the ice. Leave the ice on for 20 minutes, 2-3 times a day. Use heat as told. Use the heat source that your provider recommends, such as a moist heat pack or a heating pad. Do this as often as told. Place a towel between your skin and the heat source. Leave the heat on for 20-30 minutes. If your skin turns red, take off the ice or heat right away to prevent skin damage. The risk of damage is higher if you can't feel pain, heat, or cold. If your joint is swollen, raise it above the level of your heart as told. If your joint feels stiff in the morning, try taking a warm shower. General instructions Stay at a healthy weight. Lose weight as told. Do not smoke, vape, or use nicotine or tobacco. Where to find more information [employer]: niams.http://www.myers.net/ Contact a health care provider if: The pain gets worse. You have a fever. Get help right away if: You have very bad pain in your joint. You have swelling in your joint. Your joint is red. Many joints get painful and swollen. You have very bad back pain. Your leg is very weak. This information is not intended to replace advice given to you by your health care provider. Make sure you discuss any questions you have with your health care provider. Document Revised: 07/10/2023 Document Reviewed: 07/10/2023 Elsevier Patient Education  2025 Else[employer] Inc.Tendon Swelling (Tendinitis): What to Know  Tendinitis is irritation and swelling of a tendon. Tendons are strong tissues that connect muscle to bone. Tendinitis can happen in any tendon. The most common places for it are: The shoulder tendon. This includes the biceps tendon and rotator cuff. The ankle tendon, also called the Achilles tendon. Elbow tendons. Wrist  tendons. What are the causes? Tendinitis may be caused by: Using a tendon or muscle too much. This is the most common cause. Aging. Injury. Swelling from arthritis. Certain medicines. What increases the risk? You may get tendinitis if you do the same movements again and again. What are the signs or symptoms? Pain. Tenderness. Mild swelling. Trouble moving the affected area. How is this diagnosed? You may be diagnosed with a physical exam. You may also have tests, such as: An ultrasound. An MRI. How is this treated? Treatment may include: RICE therapy. This stands for resting, icing, applying pressure (compression), and raising (elevating) the tendon above the level of your heart. Medicines. These may help with pain and swelling. Exercises or physical therapy. These can help you move better and get stronger. A brace or splint to support the area. Shots of cortisone. Surgery. This is rare. It may be done only if other treatments don't work. Follow these instructions at home: If you have a  splint or brace: Wear the splint or brace as told. Take it off only if your health care provider says you can. Check the skin around it every day. Tell your provider if you see problems. Loosen the splint or brace if your fingers or toes tingle, are numb, or turn cold and blue. Keep the splint or brace clean. If the splint or brace isn't waterproof: Do not let it get wet. Cover it when you take a bath or shower. Use a cover that doesn't let any water  in. Managing pain, stiffness, and swelling     Use ice or an ice pack as told. If you have a splint or brace that you can take off, remove it only as told. Place a towel between your skin and the ice. Leave the ice on for 20 minutes, 2-3 times a day. Use heat as told. Use the heat source that your provider recommends, such as a moist heat pack or a heating pad. Do this as often as told. Place a towel between your skin and the heat  source. Leave the heat on for 20-30 minutes. If your skin turns red, take off the ice or heat right away to prevent skin damage. The risk of damage is higher if you can't feel pain, heat, or cold. Move your fingers or toes often to reduce stiffness and swelling. Raise the affected body part above the level of your heart while you're sitting or lying down. Use pillows as needed. Activity Rest the affected area as told. Ask when it's safe to drive if you have a splint or brace on your arm or leg. Try not to use the affected area while you have symptoms of tendinitis. Do exercises as told. Ask what things are safe for you to do at home. Ask when you can go back to work or school. General instructions Wear an elastic bandage or compression wrap only as told. Take your medicines only as told. Keep all follow-up visits. Your provider will check to see if the treatment is working. Contact a health care provider if: Your symptoms don't get better. Your symptoms get worse. You get new symptoms, such as numbness in your hands or feet. This information is not intended to replace advice given to you by your health care provider. Make sure you discuss any questions you have with your health care provider. Document Revised: 07/27/2023 Document Reviewed: 07/27/2023 Elsevier Patient Education  2025 [employer].

## 2024-03-29 NOTE — Progress Notes (Signed)
 Established Patient Office Visit  Subjective   Patient ID: Tonya Farrell, female    DOB: 29-Jul-1960  Age: 63 y.o. MRN: [id number]  Chief Complaint  Patient presents with   Pain    Right foot    63y/o caucasian female established patient reported that voltaren  gel, compression sock, epsom salt soaks, tylenol  not helping with right foot pain  Denied rash/injury.  Had bunion surgery years ago.  Foot is swelling at end of work day and is TTP midfoot.  Typically wears sneakers at work.  [employer] silver area pulling stock climbs ladders and that is typically what is most painful for her.  Spraying febreze on foot is the thing that helps the most when she gets home from work at this time.      Review of Systems  Constitutional:  Negative for chills, diaphoresis, fever and malaise/fatigue.  Cardiovascular:  Negative for leg swelling.  Musculoskeletal:  Positive for myalgias.  Skin:  Negative for itching and rash.  Neurological:  Negative for tingling, tremors, sensory change and weakness.  Psychiatric/Behavioral:  The patient does not have insomnia.       Objective:     There were no vitals taken for this visit.   Physical Exam Vitals and nursing note reviewed.  Constitutional:      General: She is awake. She is not in acute distress.    Appearance: Normal appearance. She is well-developed, well-groomed and overweight. She is not ill-appearing, toxic-appearing or diaphoretic.  HENT:     Head: Normocephalic and atraumatic.     Jaw: There is normal jaw occlusion.     Salivary Glands: Right salivary gland is not diffusely enlarged. Left salivary gland is not diffusely enlarged.     Right Ear: Hearing and external ear normal. No decreased hearing noted.     Left Ear: Hearing and external ear normal. No decreased hearing noted.     Nose: Nose normal. No congestion or rhinorrhea.     Mouth/Throat:     Lips: Pink. No lesions.     Mouth: Mucous membranes are moist. No  oral lesions or angioedema.     Dentition: No gum lesions.     Tongue: No lesions. Tongue does not deviate from midline.     Palate: No mass and lesions.     Pharynx: Oropharynx is clear. Uvula midline.  Eyes:     General: Lids are normal. Vision grossly intact. Gaze aligned appropriately. Allergic shiner present. No scleral icterus.       Right eye: No discharge.        Left eye: No discharge.     Extraocular Movements: Extraocular movements intact.     Conjunctiva/sclera: Conjunctivae normal.     Pupils: Pupils are equal, round, and reactive to light.  Neck:     Trachea: Trachea and phonation normal.  Cardiovascular:     Rate and Rhythm: Normal rate and regular rhythm.     Pulses:          Dorsalis pedis pulses are 2+ on the right side and 2+ on the left side.       Posterior tibial pulses are 2+ on the right side and 2+ on the left side.  Pulmonary:     Effort: Pulmonary effort is normal.     Breath sounds: Normal breath sounds and air entry. No stridor or transmitted upper airway sounds. No decreased breath sounds, wheezing, rhonchi or rales.     Comments: Spoke full sentences without difficulty; no  cough observed in exam room Abdominal:     General: Abdomen is flat.  Musculoskeletal:        General: Normal range of motion.     Right hand: Normal strength. Normal capillary refill.     Left hand: Normal strength. Normal capillary refill.     Cervical back: Normal range of motion and neck supple. No edema, erythema, signs of trauma, rigidity, torticollis or crepitus. No pain with movement. Normal range of motion.     Right lower leg: No deformity, lacerations, tenderness or bony tenderness. No edema.     Left lower leg: No deformity, lacerations, tenderness or bony tenderness. No edema.     Right ankle: No swelling, deformity, ecchymosis or lacerations. No tenderness. Normal range of motion.     Left ankle: No swelling, deformity, ecchymosis or lacerations. No tenderness. Normal  range of motion.     Right foot: Normal range of motion and normal capillary refill. Swelling and tenderness present. No deformity, foot drop, laceration, bony tenderness or crepitus. Normal pulse.     Left foot: Normal range of motion and normal capillary refill. No swelling, deformity, foot drop, laceration, tenderness, bony tenderness or crepitus. Normal pulse.       Feet:  Feet:     Right foot:     Skin integrity: Warmth present. No ulcer, blister, skin breakdown, erythema, callus, dry skin or fissure.     Toenail Condition: Right toenails are normal.     Left foot:     Skin integrity: No ulcer, blister, skin breakdown, erythema, warmth, callus, dry skin or fissure.     Toenail Condition: Left toenails are normal.     Comments: Medial right foot 0-1+/4 nonpitting edema localized TTP soft tissue only; no crepitus with AROM or to palpation/erythema/abrasion/rash/ecchymosis slightly increased temperature compared to opposite foot and unaffected area of foot/no edema; pain with eversion right foot only Lymphadenopathy:     Head:     Right side of head: No submandibular or preauricular adenopathy.     Left side of head: No submandibular or preauricular adenopathy.     Cervical:     Right cervical: No superficial cervical adenopathy.    Left cervical: No superficial cervical adenopathy.  Skin:    General: Skin is warm and dry.     Capillary Refill: Capillary refill takes less than 2 seconds.     Coloration: Skin is not ashen, cyanotic, jaundiced, mottled, pale or sallow.     Findings: No abrasion, abscess, acne, bruising, burn, ecchymosis, erythema, signs of injury, laceration, lesion, petechiae, rash or wound.     Nails: There is no clubbing.     Comments: Bilateral lower legs/ankle/feet and anterior face/neck/hands visually inspected  Neurological:     General: No focal deficit present.     Mental Status: She is alert and oriented to person, place, and time. Mental status is at baseline.      GCS: GCS eye subscore is 4. GCS verbal subscore is 5. GCS motor subscore is 6.     Cranial Nerves: Cranial nerves 2-12 are intact. No cranial nerve deficit, dysarthria or facial asymmetry.     Sensory: Sensation is intact.     Motor: Motor function is intact. No weakness, tremor, atrophy, abnormal muscle tone or seizure activity.     Coordination: Coordination is intact. Coordination normal.     Gait: Gait is intact. Gait normal.     Comments: In/out of chair and on/off exam table without difficulty; gait sure  and steady in clinic; bilateral hand grasp equal 5/5  Psychiatric:        Attention and Perception: Attention and perception normal.        Mood and Affect: Mood and affect normal.        Speech: Speech normal.        Behavior: Behavior normal. Behavior is cooperative.        Thought Content: Thought content normal.        Cognition and Memory: Cognition and memory normal.        Judgment: Judgment normal.      No results found for any visits on 03/29/24.    The 10-year ASCVD risk score (Arnett DK, et al., 2019) is: 12.6% Narrative  *RADIOLOGY REPORT*  Clinical Data: Trauma, 1 month forefoot plantar surface right foot pain without specific injury.  Painful right second toe.  RIGHT FOOT COMPLETE - 3+ VIEW  Comparison: None.  Findings: Slight hallux valgus, tiny 2 mm posterior and inferior degenerative calcaneal spurs.  No other significant osseous, articular or soft tissue abnormalities seen.  No fracture, stress fracture, or significant arthritis visualized.  IMPRESSION:  1.  Slight hallux valgus and tiny degenerative calcaneal spurs. 2.  Otherwise, negative.  Original Report Authenticated By: SEABRON CHARLENA ELLEN, M.D.     Assessment & Plan:   Problem List Items Addressed This Visit   None Visit Diagnoses       Acute pain of right foot    -  Primary     Discussed most likely a mix of tendonitis and arthritis.  Continue to wear sneakers with soft  padded sole.  Avoid walking barefoot at home.  Start prednisone 10mg  taper po with breakfast 20mg  x 2 days 10mg  x 2 days 5mg  x 2 days #21 RF0 and diclofenac  75mg  DR po BID X 2 weeks #30 RF0 dispensed from [facility] to patient today  Stop voltaren /diclofenac  gel use and do not take advil/aleve/naproxen/naprosyn/ibuprofren/motrin/mobic /meloxicam  while taking diclofenac  oral.  Last use of gel this am so start oral diclofenac  this evening.  May take tylenol  1000mg  po q6h prn pain or use biofreeze gel QID prn pain.  I do not recommend spraying febreze on foot due to chemical absorption in skin.  Consider ice 15 minutes and elevate if swelling up to four times per day.  If stiffness trial heat epsom salt soak/hot shower/gentle arom  15 minutes prn.  Fitted and distributed compression sleeve plantar/ankle to patient from clinic stock today.  Wear at work and prn at home if swelling noted.  Discussed if worsening will send for foot xray.  History of low vitamin D taking supplement.  Denied history of gout in the past.  NSAID and prednisone will cover for this.  Consider labs also if no improvement.  Discussed avoid high added sugar diet keep added sugars less than 25 grams per day/5 teaspoons.  Patient given handouts on foot pain/bunion/tendonitis. Next follow up in 2 weeks for re-evaluation sooner if worsening.   Patient verbalized understanding information/instructions, agreed with plan of care and had no further questions at this time.  Return in about 2 weeks (around 04/12/2024) for re-evaluation.    Ellouise DELENA Hope, NP

## 2024-03-30 NOTE — Telephone Encounter (Signed)
 Patient seen in clinic 03/29/24 started on ankle/foot compression sleeve, diclofenac  oral and prednisone low dose taper next follow up in 2 weeks.  She is established with podiatry and history of bunionectomy/low vitamin D

## 2024-04-07 NOTE — Telephone Encounter (Signed)
 Patient reported all pain and swelling has resolved with plan of care.  Feeling so much better.

## 2024-04-28 ENCOUNTER — Telehealth: Payer: Self-pay | Admitting: Registered Nurse

## 2024-04-28 ENCOUNTER — Encounter: Payer: Self-pay | Admitting: Registered Nurse

## 2024-04-28 DIAGNOSIS — M79671 Pain in right foot: Secondary | ICD-10-CM

## 2024-04-28 MED ORDER — DICLOFENAC SODIUM 75 MG PO TBEC: 75.0000 mg | DELAYED_RELEASE_TABLET | Freq: Two times a day (BID) | ORAL | 0 refills | Status: AC | PRN

## 2024-04-28 NOTE — Telephone Encounter (Signed)
 Patient last filled 30 tabs 03/29/24 stated all swelling and pain resolved.  Started to have foot pain again after stopping BID use would like to restart prn use as working overtime for holidays at work.  Dispensed 30 tabs to patient diclofenac  DR 75mg  po BID prn pain #30 RF0

## 2024-05-22 ENCOUNTER — Other Ambulatory Visit: Payer: Self-pay | Admitting: Registered Nurse

## 2024-05-22 DIAGNOSIS — Z0000 Encounter for general adult medical examination without abnormal findings: Secondary | ICD-10-CM

## 2024-05-22 DIAGNOSIS — E119 Type 2 diabetes mellitus without complications: Secondary | ICD-10-CM

## 2024-06-14 ENCOUNTER — Encounter: Payer: Self-pay | Admitting: Registered Nurse

## 2024-06-14 DIAGNOSIS — M79671 Pain in right foot: Secondary | ICD-10-CM

## 2024-06-14 MED ORDER — DICLOFENAC SODIUM 75 MG PO TBEC: 75.0000 mg | DELAYED_RELEASE_TABLET | Freq: Two times a day (BID) | ORAL | 0 refills | Status: AC | PRN

## 2024-06-14 NOTE — Telephone Encounter (Signed)
 Patient requesting refill diclofenac  last fill 04/28/2024 30 tabs  Patient reported had worked overtime during holidays and foot had started to hurt again.  Ran out of diclofenac .  Dispensed 30 tabs to patient today reminded to take with food and avoid other NSAIDS schedule appt if no improvement with plan of care.  Patient agreed with plan of care and had no further questions at this time.

## 2024-07-27 ENCOUNTER — Ambulatory Visit: Admitting: General Practice
